# Patient Record
Sex: Female | Born: 1985
Health system: Southern US, Community
[De-identification: ages and names within clinical notes are randomized; demographics above are authoritative.]

## PROBLEM LIST (undated history)

## (undated) DIAGNOSIS — D649 Anemia, unspecified: Secondary | ICD-10-CM

## (undated) DIAGNOSIS — N61 Mastitis without abscess: Secondary | ICD-10-CM

## (undated) DIAGNOSIS — Z789 Other specified health status: Secondary | ICD-10-CM

## (undated) HISTORY — DX: Other specified health status: Z78.9

## (undated) HISTORY — PX: ABDOMINAL HYSTERECTOMY: SHX81

## (undated) HISTORY — PX: EAR TUBE REMOVAL: SHX1486

## (undated) HISTORY — DX: Anemia, unspecified: D64.9

## (undated) HISTORY — DX: Mastitis without abscess: N61.0

## (undated) HISTORY — PX: WISDOM TOOTH EXTRACTION: SHX21

---

## 2013-05-21 ENCOUNTER — Ambulatory Visit (INDEPENDENT_AMBULATORY_CARE_PROVIDER_SITE_OTHER): Payer: No Typology Code available for payment source | Admitting: Obstetrics & Gynecology

## 2013-05-21 ENCOUNTER — Encounter: Payer: Self-pay | Admitting: Obstetrics & Gynecology

## 2013-05-21 VITALS — BP 130/87 | HR 96 | Resp 16 | Ht 63.0 in | Wt 169.0 lb

## 2013-05-21 DIAGNOSIS — N915 Oligomenorrhea, unspecified: Secondary | ICD-10-CM

## 2013-05-21 DIAGNOSIS — Z1151 Encounter for screening for human papillomavirus (HPV): Secondary | ICD-10-CM

## 2013-05-21 DIAGNOSIS — Z113 Encounter for screening for infections with a predominantly sexual mode of transmission: Secondary | ICD-10-CM

## 2013-05-21 DIAGNOSIS — Z01419 Encounter for gynecological examination (general) (routine) without abnormal findings: Secondary | ICD-10-CM

## 2013-05-21 DIAGNOSIS — Z124 Encounter for screening for malignant neoplasm of cervix: Secondary | ICD-10-CM

## 2013-05-21 DIAGNOSIS — L680 Hirsutism: Secondary | ICD-10-CM

## 2013-05-22 LAB — DHEA-SULFATE: DHEA-SO4: 276 ug/dL (ref 35–430)

## 2013-05-23 DIAGNOSIS — N915 Oligomenorrhea, unspecified: Secondary | ICD-10-CM | POA: Insufficient documentation

## 2013-05-23 DIAGNOSIS — L680 Hirsutism: Secondary | ICD-10-CM | POA: Insufficient documentation

## 2013-05-23 NOTE — Progress Notes (Signed)
  Subjective:     Stacey Keith is a 27 y.o. female here for a routine exam.  Current complaints: irregular menses and infertility.  Personal health questionnaire reviewed: yes.  Pt is married and here for her first gyn exam.  Pt does not want birth control as she is trying to conceive.   Gynecologic History Patient's last menstrual period was 04/16/2013. Contraception: none Last Pap: never.  Last mammogram: never.   Obstetric History OB History  No data available  nulliparous   The following portions of the patient's history were reviewed and updated as appropriate: allergies, current medications, past family history, past medical history, past social history, past surgical history and problem list.  Review of Systems Pertinent items are noted in HPI.    Objective:   Filed Vitals:   05/21/13 1543  BP: 130/87  Pulse: 96  Resp: 16  Height: 5\' 3"  (1.6 m)  Weight: 169 lb (76.658 kg)      Vitals:  WNL General appearance: alert, cooperative and no distress Head: Normocephalic, without obvious abnormality, atraumatic; mild hirsuitism Eyes: negative Throat: lips, mucosa, and tongue normal; teeth and gums normal Lungs: clear to auscultation bilaterally Breasts: normal appearance, no masses or tenderness, No nipple retraction or dimpling, No nipple discharge or bleeding Heart: regular rate and rhythm Abdomen: soft, non-tender; bowel sounds normal; no masses,  no organomegaly; hirsuitism on lower abdomen. Pelvic: cervix normal in appearance, external genitalia normal, no adnexal masses or tenderness, no bladder tenderness, no cervical motion tenderness, perianal skin: no external genital warts noted, urethra without abnormality or discharge, uterus normal size, shape, and consistency (however limited exam due to body habitus) and vagina normal without discharge Extremities: no edema, redness or tenderness in the calves or thighs Skin: no lesions or rash (hirsuitism). Lymph nodes:  Axillary adenopathy: none        Assessment:    Healthy female exam.  Infertility Probably PCO   Plan:    Education reviewed: self breast exams and skin cancer screening. Contraception: none. Follow up in: 2 weeks. TSH, prolactin, DHEA-S, 17 ohp, 2 hr GTT with fasting insulin, testosterone Pap smear  RT 2 weeks

## 2013-05-26 ENCOUNTER — Telehealth: Payer: Self-pay | Admitting: *Deleted

## 2013-05-26 ENCOUNTER — Other Ambulatory Visit: Payer: No Typology Code available for payment source

## 2013-05-26 NOTE — Telephone Encounter (Signed)
LM on voicemail that she needed to come back in for a lab only draw for her

## 2013-05-26 NOTE — Progress Notes (Signed)
Pt here for blood redraw due to lab error on 17 hydroxyprogesterone.  No charge for venipuncture

## 2013-05-29 ENCOUNTER — Other Ambulatory Visit: Payer: No Typology Code available for payment source

## 2013-05-29 ENCOUNTER — Other Ambulatory Visit: Payer: Self-pay | Admitting: Obstetrics & Gynecology

## 2013-05-30 LAB — INSULIN, FASTING: Insulin fasting, serum: 23 u[IU]/mL (ref 3–28)

## 2013-05-30 LAB — GLUCOSE TOLERANCE, 2 HOURS W/ 1HR: Glucose, Fasting: 91 mg/dL (ref 70–99)

## 2013-05-30 LAB — 17-HYDROXYPROGESTERONE: 17-OH-Progesterone, LC/MS/MS: 37 ng/dL

## 2013-06-04 ENCOUNTER — Encounter: Payer: Self-pay | Admitting: Obstetrics & Gynecology

## 2013-06-04 ENCOUNTER — Ambulatory Visit (INDEPENDENT_AMBULATORY_CARE_PROVIDER_SITE_OTHER): Payer: No Typology Code available for payment source | Admitting: Obstetrics & Gynecology

## 2013-06-04 VITALS — BP 142/89 | HR 111 | Resp 16 | Ht 63.0 in | Wt 169.0 lb

## 2013-06-04 DIAGNOSIS — N979 Female infertility, unspecified: Secondary | ICD-10-CM

## 2013-06-04 DIAGNOSIS — IMO0002 Reserved for concepts with insufficient information to code with codable children: Secondary | ICD-10-CM

## 2013-06-04 MED ORDER — FLUCONAZOLE 100 MG PO TABS: 150.0000 mg | ORAL_TABLET | Freq: Every day | ORAL | Status: DC

## 2013-06-04 MED ORDER — DOXYCYCLINE HYCLATE 50 MG PO CAPS: ORAL_CAPSULE | ORAL | Status: DC

## 2013-06-04 MED ORDER — LETROZOLE 2.5 MG PO TABS: ORAL_TABLET | ORAL | Status: DC

## 2013-06-04 NOTE — Progress Notes (Signed)
  Subjective:    Patient ID: Stacey Keith, female    DOB: Aug 25, 1986, 27 y.o.   MRN: [id number]  HPI  Pt presents for test results and to discuss infertility. No lab abnormalities---testosterone, TSH, prolactin, GTT, DHEA-S, 17 OHP Discussed semen analysis, HSG, and Femara  Review of Systems Vaginal itching and burning    Objective:   Physical Exam  Constitutional: She is oriented to person, place, and time. She appears well-developed and well-nourished.  HENT:  Head: Normocephalic and atraumatic.  Pulmonary/Chest: Effort normal.  Abdominal: Soft.  Genitourinary: Vagina normal.  Musculoskeletal: She exhibits no edema.  Neurological: She is alert and oriented to person, place, and time.  Skin: Skin is warm and dry.  Psychiatric: She has a normal mood and affect.          Assessment & Plan:  Provera withdrawal bleed HSG Semen analysis Femara 5 mg Day 3-7 Doxy prior to HSG BD affirm for symptoms, treat empirically with diflucan.

## 2013-06-10 ENCOUNTER — Encounter: Payer: Self-pay | Admitting: *Deleted

## 2013-06-10 ENCOUNTER — Telehealth: Payer: Self-pay | Admitting: *Deleted

## 2013-06-10 ENCOUNTER — Other Ambulatory Visit: Payer: Self-pay | Admitting: Obstetrics & Gynecology

## 2013-06-10 ENCOUNTER — Other Ambulatory Visit: Payer: Self-pay | Admitting: *Deleted

## 2013-06-10 DIAGNOSIS — Z139 Encounter for screening, unspecified: Secondary | ICD-10-CM

## 2013-06-10 NOTE — Telephone Encounter (Signed)
Pt needed to come in for urine specimen for GC/Chlam testing - was not completed at last OV

## 2013-06-10 NOTE — Telephone Encounter (Signed)
Pt called to ask if there was some type of medication she can take to help start a cycle. She has the femara that she will take once cycle begins but she has not had a cycle since July. Please advise.

## 2013-06-10 NOTE — Telephone Encounter (Signed)
Called pt to adv partner should see urologist due to semen analysis report - [facility] for her to call back.

## 2013-06-11 LAB — GC/CHLAMYDIA PROBE AMP
CT Probe RNA: NEGATIVE
GC Probe RNA: NEGATIVE

## 2013-06-12 ENCOUNTER — Telehealth: Payer: Self-pay | Admitting: *Deleted

## 2013-06-12 DIAGNOSIS — N920 Excessive and frequent menstruation with regular cycle: Secondary | ICD-10-CM

## 2013-06-12 MED ORDER — MEDROXYPROGESTERONE ACETATE 10 MG PO TABS: 10.0000 mg | ORAL_TABLET | Freq: Every day | ORAL | Status: DC

## 2013-06-12 NOTE — Telephone Encounter (Signed)
Called pt to adv Provera will be sent to her pharm she will pick that up and begin. She will call us once she begins her cycle to schedule HSG

## 2013-06-22 ENCOUNTER — Telehealth: Payer: Self-pay | Admitting: *Deleted

## 2013-06-22 NOTE — Telephone Encounter (Signed)
Spoke with [employer] @ [employer] for prior authorization of Femara.  Pt does not qualify for coverage of that med.  Criteria is that Dr Penne Lash would have to be an ONC and pt is taking this for breast cancer which is not the case.

## 2013-06-27 ENCOUNTER — Encounter: Payer: Self-pay | Admitting: Obstetrics & Gynecology

## 2013-06-27 DIAGNOSIS — N469 Male infertility, unspecified: Secondary | ICD-10-CM | POA: Insufficient documentation

## 2013-08-06 ENCOUNTER — Other Ambulatory Visit: Payer: Self-pay

## 2013-12-10 ENCOUNTER — Encounter: Payer: No Typology Code available for payment source | Admitting: Obstetrics and Gynecology

## 2014-02-18 ENCOUNTER — Other Ambulatory Visit (HOSPITAL_COMMUNITY): Payer: Self-pay | Admitting: Obstetrics and Gynecology

## 2014-02-18 DIAGNOSIS — Q5128 Other doubling of uterus, other specified: Secondary | ICD-10-CM

## 2014-02-18 DIAGNOSIS — Q512 Other doubling of uterus, unspecified: Principal | ICD-10-CM

## 2014-02-25 ENCOUNTER — Ambulatory Visit (HOSPITAL_COMMUNITY)
Admission: RE | Admit: 2014-02-25 | Discharge: 2014-02-25 | Disposition: A | Discharge status: Home / Self Care | Payer: No Typology Code available for payment source | Source: Ambulatory Visit | Attending: Obstetrics and Gynecology | Admitting: Obstetrics and Gynecology

## 2014-02-25 DIAGNOSIS — N979 Female infertility, unspecified: Secondary | ICD-10-CM | POA: Insufficient documentation

## 2014-02-25 DIAGNOSIS — Q5128 Other doubling of uterus, other specified: Secondary | ICD-10-CM

## 2014-02-25 DIAGNOSIS — Q512 Other doubling of uterus, unspecified: Secondary | ICD-10-CM

## 2014-02-25 MED ORDER — IOHEXOL 300 MG/ML  SOLN
20.0000 mL | Freq: Once | INTRAMUSCULAR | Status: AC | PRN
  Administered 2014-02-25: 20 mL

## 2014-03-03 ENCOUNTER — Other Ambulatory Visit: Payer: Self-pay | Admitting: Obstetrics and Gynecology

## 2014-03-03 DIAGNOSIS — Q512 Other doubling of uterus, unspecified: Principal | ICD-10-CM

## 2014-03-03 DIAGNOSIS — Q5128 Other doubling of uterus, other specified: Secondary | ICD-10-CM

## 2014-03-09 ENCOUNTER — Ambulatory Visit
Admission: RE | Admit: 2014-03-09 | Discharge: 2014-03-09 | Disposition: A | Discharge status: Home / Self Care | Payer: No Typology Code available for payment source | Source: Ambulatory Visit | Attending: Obstetrics and Gynecology | Admitting: Obstetrics and Gynecology

## 2014-03-09 DIAGNOSIS — Q512 Other doubling of uterus, unspecified: Principal | ICD-10-CM

## 2014-03-09 DIAGNOSIS — Q5128 Other doubling of uterus, other specified: Secondary | ICD-10-CM

## 2014-03-09 MED ORDER — GADOBENATE DIMEGLUMINE 529 MG/ML IV SOLN
15.0000 mL | Freq: Once | INTRAVENOUS | Status: AC | PRN
  Administered 2014-03-09: 15 mL via INTRAVENOUS

## 2014-04-27 HISTORY — PX: UTERINE SEPTUM RESECTION: SHX5386

## 2014-06-01 ENCOUNTER — Inpatient Hospital Stay (HOSPITAL_COMMUNITY)
Admission: AD | Admit: 2014-06-01 | Payer: No Typology Code available for payment source | Source: Ambulatory Visit | Admitting: Obstetrics and Gynecology

## 2014-09-02 LAB — OB RESULTS CONSOLE GC/CHLAMYDIA
CHLAMYDIA, DNA PROBE: NEGATIVE
Gonorrhea: NEGATIVE

## 2014-09-02 LAB — OB RESULTS CONSOLE HGB/HCT, BLOOD
HEMATOCRIT: 34 %
HEMOGLOBIN: 11.8 g/dL

## 2014-09-02 LAB — OB RESULTS CONSOLE ANTIBODY SCREEN: Antibody Screen: NEGATIVE

## 2014-09-02 LAB — OB RESULTS CONSOLE PLATELET COUNT: Platelets: 325 10*3/uL

## 2014-09-02 LAB — OB RESULTS CONSOLE RPR: RPR: NONREACTIVE

## 2014-09-02 LAB — OB RESULTS CONSOLE ABO/RH: RH TYPE: POSITIVE

## 2014-09-02 LAB — OB RESULTS CONSOLE HIV ANTIBODY (ROUTINE TESTING): HIV: NONREACTIVE

## 2014-10-11 ENCOUNTER — Encounter: Payer: Self-pay | Admitting: *Deleted

## 2014-10-12 ENCOUNTER — Telehealth: Payer: Self-pay | Admitting: *Deleted

## 2014-10-12 DIAGNOSIS — O09299 Supervision of pregnancy with other poor reproductive or obstetric history, unspecified trimester: Secondary | ICD-10-CM

## 2014-10-12 DIAGNOSIS — O3400 Maternal care for unspecified congenital malformation of uterus, unspecified trimester: Secondary | ICD-10-CM

## 2014-10-12 DIAGNOSIS — Z141 Cystic fibrosis carrier: Secondary | ICD-10-CM | POA: Insufficient documentation

## 2014-10-12 DIAGNOSIS — Q5128 Other doubling of uterus, other specified: Secondary | ICD-10-CM | POA: Insufficient documentation

## 2014-10-12 DIAGNOSIS — O09891 Supervision of other high risk pregnancies, first trimester: Secondary | ICD-10-CM | POA: Insufficient documentation

## 2014-10-12 DIAGNOSIS — Q512 Other doubling of uterus, unspecified: Secondary | ICD-10-CM

## 2014-10-12 DIAGNOSIS — O0900 Supervision of pregnancy with history of infertility, unspecified trimester: Secondary | ICD-10-CM

## 2014-10-12 NOTE — Telephone Encounter (Signed)
Contacted patient and informed her that we need her records from her previous OB/GYN.  Pt will call the other office and see if they can fax the information, if not she will come fill out a ROI.  Fax number for the clinic provided.

## 2014-10-13 ENCOUNTER — Encounter: Payer: Self-pay | Admitting: Family

## 2014-10-13 ENCOUNTER — Ambulatory Visit (INDEPENDENT_AMBULATORY_CARE_PROVIDER_SITE_OTHER): Payer: Medicaid Other | Admitting: Family

## 2014-10-13 VITALS — BP 130/88 | HR 102 | Temp 98.3°F | Wt 184.3 lb

## 2014-10-13 DIAGNOSIS — Q512 Other doubling of uterus, unspecified: Principal | ICD-10-CM

## 2014-10-13 DIAGNOSIS — O34599 Maternal care for other abnormalities of gravid uterus, unspecified trimester: Secondary | ICD-10-CM

## 2014-10-13 DIAGNOSIS — O3680X1 Pregnancy with inconclusive fetal viability, fetus 1: Secondary | ICD-10-CM

## 2014-10-13 DIAGNOSIS — O0900 Supervision of pregnancy with history of infertility, unspecified trimester: Secondary | ICD-10-CM

## 2014-10-13 DIAGNOSIS — L680 Hirsutism: Secondary | ICD-10-CM

## 2014-10-13 DIAGNOSIS — O3400 Maternal care for unspecified congenital malformation of uterus, unspecified trimester: Secondary | ICD-10-CM

## 2014-10-13 DIAGNOSIS — Q5128 Other doubling of uterus, other specified: Secondary | ICD-10-CM

## 2014-10-13 LAB — US OB COMP + 14 WK

## 2014-10-13 LAB — POCT URINALYSIS DIP (DEVICE)
Bilirubin Urine: NEGATIVE
Glucose, UA: NEGATIVE mg/dL
Hgb urine dipstick: NEGATIVE
KETONES UR: NEGATIVE mg/dL
NITRITE: NEGATIVE
Protein, ur: NEGATIVE mg/dL
SPECIFIC GRAVITY, URINE: 1.025 (ref 1.005–1.030)
UROBILINOGEN UA: 0.2 mg/dL (ref 0.0–1.0)
pH: 6 (ref 5.0–8.0)

## 2014-10-13 NOTE — Patient Instructions (Signed)
Second Trimester of Pregnancy The second trimester is from week 13 through week 28, months 4 through 6. The second trimester is often a time when you feel your best. Your body has also adjusted to being pregnant, and you begin to feel better physically. Usually, morning sickness has lessened or quit completely, you may have more energy, and you may have an increase in appetite. The second trimester is also a time when the fetus is growing rapidly. At the end of the sixth month, the fetus is about 9 inches long and weighs about 1 pounds. You will likely begin to feel the baby move (quickening) between 18 and 20 weeks of the pregnancy. BODY CHANGES Your body goes through many changes during pregnancy. The changes vary from woman to woman.   Your weight will continue to increase. You will notice your lower abdomen bulging out.  You may begin to get stretch marks on your hips, abdomen, and breasts.  You may develop headaches that can be relieved by medicines approved by your health care provider.  You may urinate more often because the fetus is pressing on your bladder.  You may develop or continue to have heartburn as a result of your pregnancy.  You may develop constipation because certain hormones are causing the muscles that push waste through your intestines to slow down.  You may develop hemorrhoids or swollen, bulging veins (varicose veins).  You may have back pain because of the weight gain and pregnancy hormones relaxing your joints between the bones in your pelvis and as a result of a shift in weight and the muscles that support your balance.  Your breasts will continue to grow and be tender.  Your gums may bleed and may be sensitive to brushing and flossing.  Dark spots or blotches (chloasma, mask of pregnancy) may develop on your face. This will likely fade after the baby is born.  A dark line from your belly button to the pubic area (linea nigra) may appear. This will likely fade  after the baby is born.  You may have changes in your hair. These can include thickening of your hair, rapid growth, and changes in texture. Some women also have hair loss during or after pregnancy, or hair that feels dry or thin. Your hair will most likely return to normal after your baby is born. WHAT TO EXPECT AT YOUR PRENATAL VISITS During a routine prenatal visit:  You will be weighed to make sure you and the fetus are growing normally.  Your blood pressure will be taken.  Your abdomen will be measured to track your baby's growth.  The fetal heartbeat will be listened to.  Any test results from the previous visit will be discussed. Your health care provider may ask you:  How you are feeling.  If you are feeling the baby move.  If you have had any abnormal symptoms, such as leaking fluid, bleeding, severe headaches, or abdominal cramping.  If you have any questions. Other tests that may be performed during your second trimester include:  Blood tests that check for:  Low iron levels (anemia).  Gestational diabetes (between 24 and 28 weeks).  Rh antibodies.  Urine tests to check for infections, diabetes, or protein in the urine.  An ultrasound to confirm the proper growth and development of the baby.  An amniocentesis to check for possible genetic problems.  Fetal screens for spina bifida and Down syndrome. HOME CARE INSTRUCTIONS   Avoid all smoking, herbs, alcohol, and unprescribed   drugs. These chemicals affect the formation and growth of the baby.  Follow your health care provider's instructions regarding medicine use. There are medicines that are either safe or unsafe to take during pregnancy.  Exercise only as directed by your health care provider. Experiencing uterine cramps is a good sign to stop exercising.  Continue to eat regular, healthy meals.  Wear a good support bra for breast tenderness.  Do not use hot tubs, steam rooms, or saunas.  Wear your  seat belt at all times when driving.  Avoid raw meat, uncooked cheese, cat litter boxes, and soil used by cats. These carry germs that can cause birth defects in the baby.  Take your prenatal vitamins.  Try taking a stool softener (if your health care provider approves) if you develop constipation. Eat more high-fiber foods, such as fresh vegetables or fruit and whole grains. Drink plenty of fluids to keep your urine clear or pale yellow.  Take warm sitz baths to soothe any pain or discomfort caused by hemorrhoids. Use hemorrhoid cream if your health care provider approves.  If you develop varicose veins, wear support hose. Elevate your feet for 15 minutes, 3-4 times a day. Limit salt in your diet.  Avoid heavy lifting, wear low heel shoes, and practice good posture.  Rest with your legs elevated if you have leg cramps or low back pain.  Visit your dentist if you have not gone yet during your pregnancy. Use a soft toothbrush to brush your teeth and be gentle when you floss.  A sexual relationship may be continued unless your health care provider directs you otherwise.  Continue to go to all your prenatal visits as directed by your health care provider. SEEK MEDICAL CARE IF:   You have dizziness.  You have mild pelvic cramps, pelvic pressure, or nagging pain in the abdominal area.  You have persistent nausea, vomiting, or diarrhea.  You have a bad smelling vaginal discharge.  You have pain with urination. SEEK IMMEDIATE MEDICAL CARE IF:   You have a fever.  You are leaking fluid from your vagina.  You have spotting or bleeding from your vagina.  You have severe abdominal cramping or pain.  You have rapid weight gain or loss.  You have shortness of breath with chest pain.  You notice sudden or extreme swelling of your face, hands, ankles, feet, or legs.  You have not felt your baby move in over an hour.  You have severe headaches that do not go away with  medicine.  You have vision changes. Document Released: 09/11/2001 Document Revised: 09/22/2013 Document Reviewed: 11/18/2012 Exit[employer] Patient Information 2015 [employer]. This information is not intended to replace advice given to you by your health care provider. Make sure you discuss any questions you have with your health care provider.  

## 2014-10-13 NOTE — Progress Notes (Signed)
Initial visit today. [facility] up until 8 weeks and then [facility] prior to this appointment-- had initial labs done there. Some records available and abstracted-- patient faxed over ROI to both places to have all records faxed to clinic. Awaiting records.  New OB packet given.

## 2014-10-13 NOTE — Progress Notes (Signed)
Bedside US for viability = single IUP, FHR = 150 per PW doppler, FM present.  Rochele PagesWalidah Karim, CNM notified.

## 2014-10-13 NOTE — Progress Notes (Signed)
   Subjective:    Stacey Keith is a G2P0010 3123w0d being seen today for her first obstetrical visit.  Her obstetrical history is significant for history of infertility and uterine septate surgery in July 2015.  Marland Kitchen. Patient does intend to breast feed. Pregnancy history fully reviewed.  Few labs obtained [facility] - GC/CT, HIV, Pap, RPR, ABO/RH, antibody screen.    Patient reports backache, no bleeding, no contractions and no cramping.  Filed Vitals:   10/13/14 1000  BP: 130/88  Pulse: 102  Temp: 98.3 F (36.8 C)  Weight: 184 lb 4.8 oz (83.598 kg)    HISTORY: OB History  Gravida Para Term Preterm AB SAB TAB Ectopic Multiple Living  2    1 1  0 0 0 0    # Outcome Date GA Lbr Len/2nd Weight Sex Delivery Anes PTL Lv  2 Current           1 SAB 10/2013             History reviewed. No pertinent past medical history. Past Surgical History  Procedure Laterality Date  . Uterine septum resection  04/27/2014   Family History  Problem Relation Age of Onset  . Breast cancer Paternal Grandmother     Exam   BP 130/88 mmHg  Pulse 102  Temp(Src) 98.3 F (36.8 C)  Wt 184 lb 4.8 oz (83.598 kg)  LMP 06/23/2014 (Exact Date) Uterine Size: size equals dates  Pelvic Exam:    Perineum: No Hemorrhoids, Normal Perineum   Vulva: normal   Vagina:  normal mucosa, normal discharge, no palpable nodules   pH: Not done   Cervix: no bleeding following Pap, no cervical motion tenderness and no lesions   Adnexa: normal adnexa and no mass, fullness, tenderness   Bony Pelvis: Adequate  System: Breast:  No nipple retraction or dimpling, No nipple discharge or bleeding, No axillary or supraclavicular adenopathy, Normal to palpation without dominant masses   Skin: normal coloration and turgor, no rashes    Neurologic: negative   Extremities: normal strength, tone, and muscle mass   HEENT neck supple with midline trachea and thyroid without masses   Mouth/Teeth mucous membranes moist, pharynx normal  without lesions   Neck supple and no masses   Cardiovascular: regular rate and rhythm, no murmurs or gallops   Respiratory:  appears well, vitals normal, no respiratory distress, acyanotic, normal RR, neck free of mass or lymphadenopathy, chest clear, no wheezing, crepitations, rhonchi, normal symmetric air entry   Abdomen: soft, non-tender; bowel sounds normal; no masses,  no organomegaly   Urinary: urethral meatus normal      Assessment:    Pregnancy:   29 yo G2P0010 at 4523w0d wks IUP Patient Active Problem List   Diagnosis Date Noted  . History of miscarriage, currently pregnant 10/12/2014  . Supervision of high-risk pregnancy with history of infertility 10/12/2014  . Septate uterus, antepartum 10/12/2014  . Infertility female 06/27/2013  . Oligomenorrhea 05/23/2013  . Hirsutism 05/23/2013     Plan:     Initial labs drawn. Prenatal vitamins. Problem list reviewed and updated. Genetic Screening discussed Quad Screen: ordered.  Ultrasound discussed; fetal survey: ordered.  Follow up in 4 weeks.   Marlis EdelsonKARIM, Casimira Sutphin N 10/13/2014

## 2014-10-14 LAB — AFP, QUAD SCREEN
AFP: 44.8 ng/mL
Age Alone: 1:969 {titer}
CURR GEST AGE: 16 wks.days
Down Syndrome Scr Risk Est: <1:38500 {titer}
HCG TOTAL: 21.05 [IU]/mL
INH: 94.4 pg/mL
INTERPRETATION-AFP: NEGATIVE
MOM FOR AFP: 1.51
MoM for INH: 0.64
MoM for hCG: 0.6
Open Spina bifida: NEGATIVE
Osb Risk: 1:2730 {titer}
TRI 18 SCR RISK EST: NEGATIVE
UE3 VALUE: 0.98 ng/mL
uE3 Mom: 1.29

## 2014-10-14 LAB — HEPATITIS B SURFACE ANTIGEN: HEP B S AG: NEGATIVE

## 2014-10-15 ENCOUNTER — Encounter: Payer: Self-pay | Admitting: *Deleted

## 2014-10-15 LAB — PRESCRIPTION MONITORING PROFILE (19 PANEL)
AMPHETAMINE/METH: NEGATIVE ng/mL
BENZODIAZEPINE SCREEN, URINE: NEGATIVE ng/mL
BUPRENORPHINE, URINE: NEGATIVE ng/mL
Barbiturate Screen, Urine: NEGATIVE ng/mL
CARISOPRODOL, URINE: NEGATIVE ng/mL
COCAINE METABOLITES: NEGATIVE ng/mL
Cannabinoid Scrn, Ur: NEGATIVE ng/mL
Creatinine, Urine: 94.06 mg/dL (ref >20.0)
ECSTASY: NEGATIVE ng/mL
Fentanyl, Ur: NEGATIVE ng/mL
MEPERIDINE UR: NEGATIVE ng/mL
METHADONE SCREEN, URINE: NEGATIVE ng/mL
METHAQUALONE SCREEN (URINE): NEGATIVE ng/mL
Nitrites, Initial: NEGATIVE ug/mL
OPIATE SCREEN, URINE: NEGATIVE ng/mL
Oxycodone Screen, Ur: NEGATIVE ng/mL
PHENCYCLIDINE, UR: NEGATIVE ng/mL
Propoxyphene: NEGATIVE ng/mL
TRAMADOL UR: NEGATIVE ng/mL
Tapentadol, urine: NEGATIVE ng/mL
ZOLPIDEM, URINE: NEGATIVE ng/mL
pH, Initial: 5.9 pH (ref 4.5–8.9)

## 2014-10-15 LAB — CULTURE, OB URINE: Colony Count: >=100000

## 2014-10-18 LAB — RUBELLA ANTIBODY, IGM: Rubella IgM: 0.31

## 2014-10-23 ENCOUNTER — Encounter: Payer: Self-pay | Admitting: Family

## 2014-10-23 DIAGNOSIS — O9989 Other specified diseases and conditions complicating pregnancy, childbirth and the puerperium: Secondary | ICD-10-CM

## 2014-10-23 DIAGNOSIS — O09899 Supervision of other high risk pregnancies, unspecified trimester: Secondary | ICD-10-CM | POA: Insufficient documentation

## 2014-10-23 DIAGNOSIS — Z2839 Other underimmunization status: Secondary | ICD-10-CM | POA: Insufficient documentation

## 2014-10-23 DIAGNOSIS — Z283 Underimmunization status: Secondary | ICD-10-CM | POA: Insufficient documentation

## 2014-11-01 ENCOUNTER — Encounter: Payer: Self-pay | Admitting: Obstetrics & Gynecology

## 2014-11-05 ENCOUNTER — Other Ambulatory Visit: Payer: Self-pay | Admitting: Family

## 2014-11-05 ENCOUNTER — Ambulatory Visit (HOSPITAL_COMMUNITY)
Admission: RE | Admit: 2014-11-05 | Discharge: 2014-11-05 | Disposition: A | Discharge status: Home / Self Care | Payer: Medicaid Other | Source: Ambulatory Visit | Attending: Family | Admitting: Family

## 2014-11-05 DIAGNOSIS — Z36 Encounter for antenatal screening of mother: Secondary | ICD-10-CM | POA: Diagnosis not present

## 2014-11-05 DIAGNOSIS — Z3A19 19 weeks gestation of pregnancy: Secondary | ICD-10-CM | POA: Insufficient documentation

## 2014-11-05 DIAGNOSIS — Z3689 Encounter for other specified antenatal screening: Secondary | ICD-10-CM | POA: Insufficient documentation

## 2014-11-05 DIAGNOSIS — Z0000 Encounter for general adult medical examination without abnormal findings: Secondary | ICD-10-CM | POA: Insufficient documentation

## 2014-11-05 DIAGNOSIS — O0900 Supervision of pregnancy with history of infertility, unspecified trimester: Secondary | ICD-10-CM

## 2014-11-05 DIAGNOSIS — O4420 Partial placenta previa NOS or without hemorrhage, unspecified trimester: Secondary | ICD-10-CM | POA: Insufficient documentation

## 2014-11-11 ENCOUNTER — Ambulatory Visit (INDEPENDENT_AMBULATORY_CARE_PROVIDER_SITE_OTHER): Payer: Medicaid Other | Admitting: Obstetrics & Gynecology

## 2014-11-11 VITALS — BP 122/84 | HR 96 | Wt 182.9 lb

## 2014-11-11 DIAGNOSIS — O0902 Supervision of pregnancy with history of infertility, second trimester: Secondary | ICD-10-CM

## 2014-11-11 LAB — POCT URINALYSIS DIP (DEVICE)
Bilirubin Urine: NEGATIVE
Glucose, UA: NEGATIVE mg/dL
Hgb urine dipstick: NEGATIVE
Ketones, ur: NEGATIVE mg/dL
Nitrite: NEGATIVE
PROTEIN: NEGATIVE mg/dL
Specific Gravity, Urine: 1.015 (ref 1.005–1.030)
UROBILINOGEN UA: 0.2 mg/dL (ref 0.0–1.0)
pH: 7 (ref 5.0–8.0)

## 2014-11-11 NOTE — Progress Notes (Signed)
Follow-up U/S // @ 915a with Radiology.

## 2014-11-11 NOTE — Patient Instructions (Signed)
Second Trimester of Pregnancy The second trimester is from week 13 through week 28, months 4 through 6. The second trimester is often a time when you feel your best. Your body has also adjusted to being pregnant, and you begin to feel better physically. Usually, morning sickness has lessened or quit completely, you may have more energy, and you may have an increase in appetite. The second trimester is also a time when the fetus is growing rapidly. At the end of the sixth month, the fetus is about 9 inches long and weighs about 1 pounds. You will likely begin to feel the baby move (quickening) between 18 and 20 weeks of the pregnancy. BODY CHANGES Your body goes through many changes during pregnancy. The changes vary from woman to woman.   Your weight will continue to increase. You will notice your lower abdomen bulging out.  You may begin to get stretch marks on your hips, abdomen, and breasts.  You may develop headaches that can be relieved by medicines approved by your health care provider.  You may urinate more often because the fetus is pressing on your bladder.  You may develop or continue to have heartburn as a result of your pregnancy.  You may develop constipation because certain hormones are causing the muscles that push waste through your intestines to slow down.  You may develop hemorrhoids or swollen, bulging veins (varicose veins).  You may have back pain because of the weight gain and pregnancy hormones relaxing your joints between the bones in your pelvis and as a result of a shift in weight and the muscles that support your balance.  Your breasts will continue to grow and be tender.  Your gums may bleed and may be sensitive to brushing and flossing.  Dark spots or blotches (chloasma, mask of pregnancy) may develop on your face. This will likely fade after the baby is born.  A dark line from your belly button to the pubic area (linea nigra) may appear. This will likely fade  after the baby is born.  You may have changes in your hair. These can include thickening of your hair, rapid growth, and changes in texture. Some women also have hair loss during or after pregnancy, or hair that feels dry or thin. Your hair will most likely return to normal after your baby is born. WHAT TO EXPECT AT YOUR PRENATAL VISITS During a routine prenatal visit:  You will be weighed to make sure you and the fetus are growing normally.  Your blood pressure will be taken.  Your abdomen will be measured to track your baby's growth.  The fetal heartbeat will be listened to.  Any test results from the previous visit will be discussed. Your health care provider may ask you:  How you are feeling.  If you are feeling the baby move.  If you have had any abnormal symptoms, such as leaking fluid, bleeding, severe headaches, or abdominal cramping.  If you have any questions. Other tests that may be performed during your second trimester include:  Blood tests that check for:  Low iron levels (anemia).  Gestational diabetes (between 24 and 28 weeks).  Rh antibodies.  Urine tests to check for infections, diabetes, or protein in the urine.  An ultrasound to confirm the proper growth and development of the baby.  An amniocentesis to check for possible genetic problems.  Fetal screens for spina bifida and Down syndrome. HOME CARE INSTRUCTIONS   Avoid all smoking, herbs, alcohol, and unprescribed   drugs. These chemicals affect the formation and growth of the baby.  Follow your health care provider's instructions regarding medicine use. There are medicines that are either safe or unsafe to take during pregnancy.  Exercise only as directed by your health care provider. Experiencing uterine cramps is a good sign to stop exercising.  Continue to eat regular, healthy meals.  Wear a good support bra for breast tenderness.  Do not use hot tubs, steam rooms, or saunas.  Wear your  seat belt at all times when driving.  Avoid raw meat, uncooked cheese, cat litter boxes, and soil used by cats. These carry germs that can cause birth defects in the baby.  Take your prenatal vitamins.  Try taking a stool softener (if your health care provider approves) if you develop constipation. Eat more high-fiber foods, such as fresh vegetables or fruit and whole grains. Drink plenty of fluids to keep your urine clear or pale yellow.  Take warm sitz baths to soothe any pain or discomfort caused by hemorrhoids. Use hemorrhoid cream if your health care provider approves.  If you develop varicose veins, wear support hose. Elevate your feet for 15 minutes, 3-4 times a day. Limit salt in your diet.  Avoid heavy lifting, wear low heel shoes, and practice good posture.  Rest with your legs elevated if you have leg cramps or low back pain.  Visit your dentist if you have not gone yet during your pregnancy. Use a soft toothbrush to brush your teeth and be gentle when you floss.  A sexual relationship may be continued unless your health care provider directs you otherwise.  Continue to go to all your prenatal visits as directed by your health care provider. SEEK MEDICAL CARE IF:   You have dizziness.  You have mild pelvic cramps, pelvic pressure, or nagging pain in the abdominal area.  You have persistent nausea, vomiting, or diarrhea.  You have a bad smelling vaginal discharge.  You have pain with urination. SEEK IMMEDIATE MEDICAL CARE IF:   You have a fever.  You are leaking fluid from your vagina.  You have spotting or bleeding from your vagina.  You have severe abdominal cramping or pain.  You have rapid weight gain or loss.  You have shortness of breath with chest pain.  You notice sudden or extreme swelling of your face, hands, ankles, feet, or legs.  You have not felt your baby move in over an hour.  You have severe headaches that do not go away with  medicine.  You have vision changes. Document Released: 09/11/2001 Document Revised: 09/22/2013 Document Reviewed: 11/18/2012 Exit[employer] Patient Information 2015 [employer]. This information is not intended to replace advice given to you by your health care provider. Make sure you discuss any questions you have with your health care provider.  

## 2014-11-11 NOTE — Progress Notes (Signed)
US was nl last week except LLP 1.4 cm from os, repeat scan 6 weeks

## 2014-12-09 ENCOUNTER — Ambulatory Visit (INDEPENDENT_AMBULATORY_CARE_PROVIDER_SITE_OTHER): Payer: Medicaid Other | Admitting: Family Medicine

## 2014-12-09 VITALS — BP 126/74 | HR 81 | Temp 98.2°F | Wt 185.9 lb

## 2014-12-09 DIAGNOSIS — O0902 Supervision of pregnancy with history of infertility, second trimester: Secondary | ICD-10-CM

## 2014-12-09 LAB — POCT URINALYSIS DIP (DEVICE)
Bilirubin Urine: NEGATIVE
GLUCOSE, UA: NEGATIVE mg/dL
Hgb urine dipstick: NEGATIVE
KETONES UR: NEGATIVE mg/dL
Nitrite: NEGATIVE
Protein, ur: NEGATIVE mg/dL
Specific Gravity, Urine: 1.015 (ref 1.005–1.030)
UROBILINOGEN UA: 0.2 mg/dL (ref 0.0–1.0)
pH: 8.5 — ABNORMAL HIGH (ref 5.0–8.0)

## 2014-12-09 NOTE — Progress Notes (Signed)
Patient is 29 y.o. G2P0010 4750w1d.  +FM, denies LOF, VB, contractions; has light vaginal discharge.  Overall feeling well. Has sono scheduled to re-eval placenta

## 2014-12-17 ENCOUNTER — Ambulatory Visit (HOSPITAL_COMMUNITY)
Admission: RE | Admit: 2014-12-17 | Discharge: 2014-12-17 | Disposition: A | Discharge status: Home / Self Care | Payer: Medicaid Other | Source: Ambulatory Visit | Attending: Obstetrics & Gynecology | Admitting: Obstetrics & Gynecology

## 2014-12-17 DIAGNOSIS — Z3A25 25 weeks gestation of pregnancy: Secondary | ICD-10-CM | POA: Insufficient documentation

## 2014-12-17 DIAGNOSIS — Z0489 Encounter for examination and observation for other specified reasons: Secondary | ICD-10-CM | POA: Insufficient documentation

## 2014-12-17 DIAGNOSIS — O0902 Supervision of pregnancy with history of infertility, second trimester: Secondary | ICD-10-CM

## 2014-12-17 DIAGNOSIS — O3400 Maternal care for unspecified congenital malformation of uterus, unspecified trimester: Secondary | ICD-10-CM | POA: Insufficient documentation

## 2014-12-17 DIAGNOSIS — Z36 Encounter for antenatal screening of mother: Secondary | ICD-10-CM | POA: Diagnosis not present

## 2014-12-17 DIAGNOSIS — O4402 Placenta previa specified as without hemorrhage, second trimester: Secondary | ICD-10-CM | POA: Insufficient documentation

## 2014-12-17 DIAGNOSIS — IMO0002 Reserved for concepts with insufficient information to code with codable children: Secondary | ICD-10-CM | POA: Insufficient documentation

## 2014-12-17 DIAGNOSIS — O4400 Placenta previa specified as without hemorrhage, unspecified trimester: Secondary | ICD-10-CM | POA: Insufficient documentation

## 2014-12-20 ENCOUNTER — Telehealth: Payer: Self-pay | Admitting: General Practice

## 2014-12-20 NOTE — Telephone Encounter (Signed)
Patient called and left message stating she is returning our call and that we can leave results on her voicemail

## 2014-12-20 NOTE — Telephone Encounter (Signed)
Patient called and left message stating she would like her results from her ultrasound on Friday. Called patient, no answer- left message stating we are trying to return your phone call, please call us back at the clinics and you can let us know if we can leave detailed information on your voicemail or not

## 2014-12-21 NOTE — Telephone Encounter (Signed)
Called patient and informed her of normal ultrasound. Patient verbalized understanding and asked about her placenta. Told patient her placenta has moved back up to a normal location. Patient verbalized understanding and had no questions

## 2015-01-06 ENCOUNTER — Ambulatory Visit (INDEPENDENT_AMBULATORY_CARE_PROVIDER_SITE_OTHER): Payer: Self-pay | Admitting: Family Medicine

## 2015-01-06 VITALS — BP 124/76 | HR 88 | Wt 189.9 lb

## 2015-01-06 DIAGNOSIS — O0900 Supervision of pregnancy with history of infertility, unspecified trimester: Secondary | ICD-10-CM

## 2015-01-06 DIAGNOSIS — O4413 Placenta previa with hemorrhage, third trimester: Secondary | ICD-10-CM

## 2015-01-06 DIAGNOSIS — Z23 Encounter for immunization: Secondary | ICD-10-CM

## 2015-01-06 DIAGNOSIS — O4420 Partial placenta previa NOS or without hemorrhage, unspecified trimester: Secondary | ICD-10-CM

## 2015-01-06 DIAGNOSIS — O0903 Supervision of pregnancy with history of infertility, third trimester: Secondary | ICD-10-CM

## 2015-01-06 LAB — POCT URINALYSIS DIP (DEVICE)
BILIRUBIN URINE: NEGATIVE
GLUCOSE, UA: NEGATIVE mg/dL
HGB URINE DIPSTICK: NEGATIVE
Ketones, ur: NEGATIVE mg/dL
NITRITE: NEGATIVE
Protein, ur: NEGATIVE mg/dL
Specific Gravity, Urine: 1.02 (ref 1.005–1.030)
Urobilinogen, UA: 0.2 mg/dL (ref 0.0–1.0)
pH: 7 (ref 5.0–8.0)

## 2015-01-06 LAB — CBC
HEMATOCRIT: 30.6 % — AB (ref 36.0–46.0)
Hemoglobin: 10.1 g/dL — ABNORMAL LOW (ref 12.0–15.0)
MCH: 27.2 pg (ref 26.0–34.0)
MCHC: 33 g/dL (ref 30.0–36.0)
MCV: 82.3 fL (ref 78.0–100.0)
MPV: 8.9 fL (ref 8.6–12.4)
Platelets: 279 10*3/uL (ref 150–400)
RBC: 3.72 MIL/uL — ABNORMAL LOW (ref 3.87–5.11)
RDW: 14.8 % (ref 11.5–15.5)
WBC: 8.3 10*3/uL (ref 4.0–10.5)

## 2015-01-06 MED ORDER — TETANUS-DIPHTH-ACELL PERTUSSIS 5-2.5-18.5 LF-MCG/0.5 IM SUSP
0.5000 mL | Freq: Once | INTRAMUSCULAR | Status: AC
  Administered 2015-01-06: 0.5 mL via INTRAMUSCULAR

## 2015-01-06 NOTE — Progress Notes (Signed)
Mod leuks in urine.  

## 2015-01-06 NOTE — Progress Notes (Signed)
Patient is 29 y.o. G2P0010 1459w1d.  +FM, denies LOF, VB, contractions, slightly increased white vaginal discharge, denies itchiness/irritation/complaints.  Overall feeling well. - repeat sono @ 25w with previa resolved

## 2015-01-07 LAB — HIV ANTIBODY (ROUTINE TESTING W REFLEX): HIV 1&2 Ab, 4th Generation: NONREACTIVE

## 2015-01-07 LAB — GLUCOSE TOLERANCE, 1 HOUR (50G) W/O FASTING: Glucose, 1 Hour GTT: 144 mg/dL — ABNORMAL HIGH (ref 70–140)

## 2015-01-07 LAB — RPR

## 2015-01-12 ENCOUNTER — Telehealth: Payer: Self-pay | Admitting: *Deleted

## 2015-01-12 NOTE — Telephone Encounter (Signed)
Pt left message requesting results of GTT. She stated that a message can be left on her voice mail. Call was returned to pt and a detailed message was left stating that her 1hr GTT was abnormal. She will need to have a 3hr GTT to determine if she has gestational diabetes. Procedural information for the 3hr GTT was stated. Please call back to the appt line and schedule your test. If you have questions you may leave a new message on the nurse voice mail.

## 2015-01-14 ENCOUNTER — Other Ambulatory Visit: Payer: Self-pay

## 2015-01-14 DIAGNOSIS — O99814 Abnormal glucose complicating childbirth: Secondary | ICD-10-CM

## 2015-01-15 LAB — GLUCOSE TOLERANCE, 3 HOURS
GLUCOSE, FASTING-GESTATIONAL: 81 mg/dL (ref 70–104)
Glucose Tolerance, 1 hour: 139 mg/dL (ref 70–189)
Glucose Tolerance, 2 hour: 104 mg/dL (ref 70–164)
Glucose, GTT - 3 Hour: 93 mg/dL (ref 70–144)

## 2015-01-18 ENCOUNTER — Telehealth: Payer: Self-pay

## 2015-01-18 NOTE — Telephone Encounter (Signed)
Patient called requeting 3hr results and states results can be left over voicemail. Called patient and left message informing her of normal results.

## 2015-01-27 ENCOUNTER — Ambulatory Visit (INDEPENDENT_AMBULATORY_CARE_PROVIDER_SITE_OTHER): Payer: Self-pay | Admitting: Family Medicine

## 2015-01-27 VITALS — BP 123/85 | HR 95 | Temp 98.3°F | Wt 191.7 lb

## 2015-01-27 DIAGNOSIS — O99619 Diseases of the digestive system complicating pregnancy, unspecified trimester: Secondary | ICD-10-CM

## 2015-01-27 DIAGNOSIS — K589 Irritable bowel syndrome without diarrhea: Secondary | ICD-10-CM

## 2015-01-27 LAB — POCT URINALYSIS DIP (DEVICE)
BILIRUBIN URINE: NEGATIVE
Glucose, UA: NEGATIVE mg/dL
HGB URINE DIPSTICK: NEGATIVE
Ketones, ur: NEGATIVE mg/dL
Nitrite: NEGATIVE
PH: 7 (ref 5.0–8.0)
Protein, ur: NEGATIVE mg/dL
Specific Gravity, Urine: 1.02 (ref 1.005–1.030)
Urobilinogen, UA: 0.2 mg/dL (ref 0.0–1.0)

## 2015-01-27 NOTE — Progress Notes (Signed)
Moderate leuks on Udip 

## 2015-01-27 NOTE — Progress Notes (Signed)
Patient is 29 y.o. G2P0010 7957w1d.  +FM, denies LOF, VB, contractions. Overall feeling well. # diarrhea: x2d, up to 3/day, no blood, no recent sick contacts, no recent abx, nothing improves, notices that it's usually 1h after meals.  Some abdominal cramping before, had similar problems in 1st trimester and before pregnancy ==> likely has irritable bowel syndrome

## 2015-01-27 NOTE — Patient Instructions (Signed)
Irritable Bowel Syndrome Irritable bowel syndrome (IBS) is caused by a disturbance of normal bowel function and is a common digestive disorder. You may also hear this condition called spastic colon, mucous colitis, and irritable colon. There is no cure for IBS. However, symptoms often gradually improve or disappear with a good diet, stress management, and medicine. This condition usually appears in late adolescence or early adulthood. Women develop it twice as often as men. CAUSES  After food has been digested and absorbed in the small intestine, waste material is moved into the large intestine, or colon. In the colon, water and salts are absorbed from the undigested products coming from the small intestine. The remaining residue, or fecal material, is held for elimination. Under normal circumstances, gentle, rhythmic contractions of the bowel walls push the fecal material along the colon toward the rectum. In IBS, however, these contractions are irregular and poorly coordinated. The fecal material is either retained too long, resulting in constipation, or expelled too soon, producing diarrhea. SIGNS AND SYMPTOMS  The most common symptom of IBS is abdominal pain. It is often in the lower left side of the abdomen, but it may occur anywhere in the abdomen. The pain comes from spasms of the bowel muscles happening too much and from the buildup of gas and fecal material in the colon. This pain:  Can range from sharp abdominal cramps to a dull, continuous ache.  Often worsens soon after eating.  Is often relieved by having a bowel movement or passing gas. Abdominal pain is usually accompanied by constipation, but it may also produce diarrhea. The diarrhea often occurs right after a meal or upon waking up in the morning. The stools are often soft, watery, and flecked with mucus. Other symptoms of IBS include:  Bloating.  Loss of appetite.  Heartburn.  Backache.  Dull pain in the arms or  shoulders.  Nausea.  Burping.  Vomiting.  Gas. IBS may also cause symptoms that are unrelated to the digestive system, such as:  Fatigue.  Headaches.  Anxiety.  Shortness of breath.  Trouble concentrating.  Dizziness. These symptoms tend to come and go. DIAGNOSIS  The symptoms of IBS may seem like symptoms of other, more serious digestive disorders. Your health care provider may want to perform tests to exclude these disorders.  TREATMENT Many medicines are available to help correct bowel function or relieve bowel spasms and abdominal pain. Among the medicines available are:  Laxatives for severe constipation and to help restore normal bowel habits.  Specific antidiarrheal medicines to treat severe or lasting diarrhea.  Antispasmodic agents to relieve intestinal cramps. Your health care provider may also decide to treat you with a mild tranquilizer or sedative during unusually stressful periods in your life. Your health care provider may also prescribe antidepressant medicine. The use of this medicine has been shown to reduce pain and other symptoms of IBS. Remember that if any medicine is prescribed for you, you should take it exactly as directed. Make sure your health care provider knows how well it worked for you. HOME CARE INSTRUCTIONS   Take all medicines as directed by your health care provider.  Avoid foods that are high in fat or oils, such as heavy cream, butter, frankfurters, sausage, and other fatty meats.  Avoid foods that make you go to the bathroom, such as fruit, fruit juice, and dairy products.  Cut out carbonated drinks, chewing gum, and "gassy" foods such as beans and cabbage. This may help relieve bloating and burping.    Eat foods with bran, and drink plenty of liquids with the bran foods. This helps relieve constipation.  Keep track of what foods seem to bring on your symptoms.  Avoid emotionally charged situations or circumstances that produce  anxiety.  Start or continue exercising.  Get plenty of rest and sleep. Document Released: 09/17/2005 Document Revised: 09/22/2013 Document Reviewed: 05/07/2008 ExitCare Patient Information 2015 [employer]. This information is not intended to replace advice given to you by your health care provider. Make sure you discuss any questions you have with your health care provider. Diet and Irritable Bowel Syndrome  No cure has been found for irritable bowel syndrome (IBS). Many options are available to treat the symptoms. Your caregiver will give you the best treatments available for your symptoms. He or she will also encourage you to manage stress and to make changes to your diet. You need to work with your caregiver and Registered Dietician to find the best combination of medicine, diet, counseling, and support to control your symptoms. The following are some diet suggestions. FOODS THAT MAKE IBS WORSE  Fatty foods, such as French fries.  Milk products, such as cheese or ice cream.  Chocolate.  Alcohol.  Caffeine (found in coffee and some sodas).  Carbonated drinks, such as soda. If certain foods cause symptoms, you should eat less of them or stop eating them. FOOD JOURNAL   Keep a journal of the foods that seem to cause distress. Write down:  What you are eating during the day and when.  What problems you are having after eating.  When the symptoms occur in relation to your meals.  What foods always make you feel badly.  Take your notes with you to your caregiver to see if you should stop eating certain foods. FOODS THAT MAKE IBS BETTER Fiber reduces IBS symptoms, especially constipation, because it makes stools soft, bulky, and easier to pass. Fiber is found in bran, bread, cereal, beans, fruit, and vegetables. Examples of foods with fiber include:  Apples.  Peaches.  Pears.  Berries.  Figs.  Broccoli, raw.  Cabbage.  Carrots.  Raw peas.  Kidney  beans.  Lima beans.  Whole-grain bread.  Whole-grain cereal. Add foods with fiber to your diet a little at a time. This will let your body get used to them. Too much fiber at once might cause gas and swelling of your abdomen. This can trigger symptoms in a person with IBS. Caregivers usually recommend a diet with enough fiber to produce soft, painless bowel movements. High fiber diets may cause gas and bloating. However, these symptoms often go away within a few weeks, as your body adjusts. In many cases, dietary fiber may lessen IBS symptoms, particularly constipation. However, it may not help pain or diarrhea. High fiber diets keep the colon mildly enlarged (distended) with the added fiber. This may help prevent spasms in the colon. Some forms of fiber also keep water in the stool, thereby preventing hard stools that are difficult to pass.  Besides telling you to eat more foods with fiber, your caregiver may also tell you to get more fiber by taking a fiber pill or drinking water mixed with a special high fiber powder. An example of this is a natural fiber laxative containing psyllium seed.  TIPS  Large meals can cause cramping and diarrhea in people with IBS. If this happens to you, try eating 4 or 5 small meals a day, or try eating less at each of your usual 3 meals.   It may also help if your meals are low in fat and high in carbohydrates. Examples of carbohydrates are pasta, rice, whole-grain breads and cereals, fruits, and vegetables.  If dairy products cause your symptoms to flare up, you can try eating less of those foods. You might be able to handle yogurt better than other dairy products, because it contains bacteria that helps with digestion. Dairy products are an important source of calcium and other nutrients. If you need to avoid dairy products, be sure to talk with a Registered Dietitian about getting these nutrients through other food sources.  Drink enough water and fluids to keep  your urine clear or pale yellow. This is important, especially if you have diarrhea. FOR MORE INFORMATION  International Foundation for Functional Gastrointestinal Disorders: www.iffgd.org  National Digestive Diseases Information Clearinghouse: digestive.niddk.nih.gov Document Released: 12/08/2003 Document Revised: 12/10/2011 Document Reviewed: 12/18/2013 ExitCare Patient Information 2015 [employer]. This information is not intended to replace advice given to you by your health care provider. Make sure you discuss any questions you have with your health care provider.  

## 2015-01-27 NOTE — Progress Notes (Signed)
Pt had diarrhea the past 2 ways

## 2015-02-10 ENCOUNTER — Encounter: Payer: Self-pay | Admitting: Obstetrics and Gynecology

## 2015-02-10 ENCOUNTER — Ambulatory Visit (INDEPENDENT_AMBULATORY_CARE_PROVIDER_SITE_OTHER): Payer: Medicaid Other | Admitting: Obstetrics and Gynecology

## 2015-02-10 VITALS — BP 129/83 | HR 89 | Temp 98.4°F | Wt 194.2 lb

## 2015-02-10 DIAGNOSIS — K589 Irritable bowel syndrome without diarrhea: Secondary | ICD-10-CM

## 2015-02-10 DIAGNOSIS — O0903 Supervision of pregnancy with history of infertility, third trimester: Secondary | ICD-10-CM

## 2015-02-10 DIAGNOSIS — O99613 Diseases of the digestive system complicating pregnancy, third trimester: Secondary | ICD-10-CM

## 2015-02-10 DIAGNOSIS — Z283 Underimmunization status: Secondary | ICD-10-CM

## 2015-02-10 DIAGNOSIS — Z2839 Other underimmunization status: Secondary | ICD-10-CM

## 2015-02-10 DIAGNOSIS — O9989 Other specified diseases and conditions complicating pregnancy, childbirth and the puerperium: Secondary | ICD-10-CM

## 2015-02-10 LAB — POCT URINALYSIS DIP (DEVICE)
Bilirubin Urine: NEGATIVE
GLUCOSE, UA: NEGATIVE mg/dL
KETONES UR: NEGATIVE mg/dL
Nitrite: NEGATIVE
Protein, ur: NEGATIVE mg/dL
Specific Gravity, Urine: 1.015 (ref 1.005–1.030)
UROBILINOGEN UA: 0.2 mg/dL (ref 0.0–1.0)
pH: 7.5 (ref 5.0–8.0)

## 2015-02-10 NOTE — Progress Notes (Signed)
Pain- sharp pain lasting seconds going to the cervix, right side pain

## 2015-02-10 NOTE — Progress Notes (Signed)
Patient is doing well without complaints. FM/PTL precautions reviewed. Patient is undecided on contraception, probably condoms

## 2015-02-17 ENCOUNTER — Ambulatory Visit (INDEPENDENT_AMBULATORY_CARE_PROVIDER_SITE_OTHER): Payer: Medicaid Other | Admitting: Obstetrics & Gynecology

## 2015-02-17 VITALS — BP 125/83 | HR 102 | Temp 98.4°F | Wt 194.1 lb

## 2015-02-17 DIAGNOSIS — O0903 Supervision of pregnancy with history of infertility, third trimester: Secondary | ICD-10-CM

## 2015-02-17 LAB — POCT URINALYSIS DIP (DEVICE)
Bilirubin Urine: NEGATIVE
Glucose, UA: NEGATIVE mg/dL
Hgb urine dipstick: NEGATIVE
Ketones, ur: NEGATIVE mg/dL
NITRITE: NEGATIVE
PH: 6 (ref 5.0–8.0)
PROTEIN: NEGATIVE mg/dL
Specific Gravity, Urine: 1.02 (ref 1.005–1.030)
UROBILINOGEN UA: 0.2 mg/dL (ref 0.0–1.0)

## 2015-02-17 NOTE — Patient Instructions (Signed)
Third Trimester of Pregnancy The third trimester is from week 29 through week 42, months 7 through 9. The third trimester is a time when the fetus is growing rapidly. At the end of the ninth month, the fetus is about 20 inches in length and weighs 6-10 pounds.  BODY CHANGES Your body goes through many changes during pregnancy. The changes vary from woman to woman.   Your weight will continue to increase. You can expect to gain 25-35 pounds (11-16 kg) by the end of the pregnancy.  You may begin to get stretch marks on your hips, abdomen, and breasts.  You may urinate more often because the fetus is moving lower into your pelvis and pressing on your bladder.  You may develop or continue to have heartburn as a result of your pregnancy.  You may develop constipation because certain hormones are causing the muscles that push waste through your intestines to slow down.  You may develop hemorrhoids or swollen, bulging veins (varicose veins).  You may have pelvic pain because of the weight gain and pregnancy hormones relaxing your joints between the bones in your pelvis. Backaches may result from overexertion of the muscles supporting your posture.  You may have changes in your hair. These can include thickening of your hair, rapid growth, and changes in texture. Some women also have hair loss during or after pregnancy, or hair that feels dry or thin. Your hair will most likely return to normal after your baby is born.  Your breasts will continue to grow and be tender. A yellow discharge may leak from your breasts called colostrum.  Your belly button may stick out.  You may feel short of breath because of your expanding uterus.  You may notice the fetus "dropping," or moving lower in your abdomen.  You may have a bloody mucus discharge. This usually occurs a few days to a week before labor begins.  Your cervix becomes thin and soft (effaced) near your due date. WHAT TO EXPECT AT YOUR PRENATAL  EXAMS  You will have prenatal exams every 2 weeks until week 36. Then, you will have weekly prenatal exams. During a routine prenatal visit:  You will be weighed to make sure you and the fetus are growing normally.  Your blood pressure is taken.  Your abdomen will be measured to track your baby's growth.  The fetal heartbeat will be listened to.  Any test results from the previous visit will be discussed.  You may have a cervical check near your due date to see if you have effaced. At around 36 weeks, your caregiver will check your cervix. At the same time, your caregiver will also perform a test on the secretions of the vaginal tissue. This test is to determine if a type of bacteria, Group B streptococcus, is present. Your caregiver will explain this further. Your caregiver may ask you:  What your birth plan is.  How you are feeling.  If you are feeling the baby move.  If you have had any abnormal symptoms, such as leaking fluid, bleeding, severe headaches, or abdominal cramping.  If you have any questions. Other tests or screenings that may be performed during your third trimester include:  Blood tests that check for low iron levels (anemia).  Fetal testing to check the health, activity level, and growth of the fetus. Testing is done if you have certain medical conditions or if there are problems during the pregnancy. FALSE LABOR You may feel small, irregular contractions that   eventually go away. These are called Braxton Hicks contractions, or false labor. Contractions may last for hours, days, or even weeks before true labor sets in. If contractions come at regular intervals, intensify, or become painful, it is best to be seen by your caregiver.  SIGNS OF LABOR   Menstrual-like cramps.  Contractions that are 5 minutes apart or less.  Contractions that start on the top of the uterus and spread down to the lower abdomen and back.  A sense of increased pelvic pressure or back  pain.  A watery or bloody mucus discharge that comes from the vagina. If you have any of these signs before the 37th week of pregnancy, call your caregiver right away. You need to go to the hospital to get checked immediately. HOME CARE INSTRUCTIONS   Avoid all smoking, herbs, alcohol, and unprescribed drugs. These chemicals affect the formation and growth of the baby.  Follow your caregiver's instructions regarding medicine use. There are medicines that are either safe or unsafe to take during pregnancy.  Exercise only as directed by your caregiver. Experiencing uterine cramps is a good sign to stop exercising.  Continue to eat regular, healthy meals.  Wear a good support bra for breast tenderness.  Do not use hot tubs, steam rooms, or saunas.  Wear your seat belt at all times when driving.  Avoid raw meat, uncooked cheese, cat litter boxes, and soil used by cats. These carry germs that can cause birth defects in the baby.  Take your prenatal vitamins.  Try taking a stool softener (if your caregiver approves) if you develop constipation. Eat more high-fiber foods, such as fresh vegetables or fruit and whole grains. Drink plenty of fluids to keep your urine clear or pale yellow.  Take warm sitz baths to soothe any pain or discomfort caused by hemorrhoids. Use hemorrhoid cream if your caregiver approves.  If you develop varicose veins, wear support hose. Elevate your feet for 15 minutes, 3-4 times a day. Limit salt in your diet.  Avoid heavy lifting, wear low heal shoes, and practice good posture.  Rest a lot with your legs elevated if you have leg cramps or low back pain.  Visit your dentist if you have not gone during your pregnancy. Use a soft toothbrush to brush your teeth and be gentle when you floss.  A sexual relationship may be continued unless your caregiver directs you otherwise.  Do not travel far distances unless it is absolutely necessary and only with the approval  of your caregiver.  Take prenatal classes to understand, practice, and ask questions about the labor and delivery.  Make a trial run to the hospital.  Pack your hospital bag.  Prepare the baby's nursery.  Continue to go to all your prenatal visits as directed by your caregiver. SEEK MEDICAL CARE IF:  You are unsure if you are in labor or if your water has broken.  You have dizziness.  You have mild pelvic cramps, pelvic pressure, or nagging pain in your abdominal area.  You have persistent nausea, vomiting, or diarrhea.  You have a bad smelling vaginal discharge.  You have pain with urination. SEEK IMMEDIATE MEDICAL CARE IF:   You have a fever.  You are leaking fluid from your vagina.  You have spotting or bleeding from your vagina.  You have severe abdominal cramping or pain.  You have rapid weight loss or gain.  You have shortness of breath with chest pain.  You notice sudden or extreme swelling   of your face, hands, ankles, feet, or legs.  You have not felt your baby move in over an hour.  You have severe headaches that do not go away with medicine.  You have vision changes. Document Released: 09/11/2001 Document Revised: 09/22/2013 Document Reviewed: 11/18/2012 ExitCare Patient Information 2015 [employer]. This information is not intended to replace advice given to you by your health care provider. Make sure you discuss any questions you have with your health care provider.  

## 2015-02-17 NOTE — Progress Notes (Signed)
Small leuks on UA; reports pain around pubic bone

## 2015-02-17 NOTE — Progress Notes (Signed)
No complaints, IBS is quiet now

## 2015-03-03 ENCOUNTER — Other Ambulatory Visit: Payer: Self-pay | Admitting: Family

## 2015-03-03 ENCOUNTER — Ambulatory Visit (INDEPENDENT_AMBULATORY_CARE_PROVIDER_SITE_OTHER): Payer: Medicaid Other | Admitting: Family

## 2015-03-03 VITALS — BP 137/83 | HR 94 | Temp 98.3°F | Wt 198.1 lb

## 2015-03-03 DIAGNOSIS — O0993 Supervision of high risk pregnancy, unspecified, third trimester: Secondary | ICD-10-CM

## 2015-03-03 DIAGNOSIS — O0903 Supervision of pregnancy with history of infertility, third trimester: Secondary | ICD-10-CM

## 2015-03-03 LAB — POCT URINALYSIS DIP (DEVICE)
Bilirubin Urine: NEGATIVE
Glucose, UA: NEGATIVE mg/dL
HGB URINE DIPSTICK: NEGATIVE
KETONES UR: NEGATIVE mg/dL
Nitrite: NEGATIVE
PROTEIN: NEGATIVE mg/dL
Specific Gravity, Urine: 1.015 (ref 1.005–1.030)
UROBILINOGEN UA: 0.2 mg/dL (ref 0.0–1.0)
pH: 6.5 (ref 5.0–8.0)

## 2015-03-03 LAB — OB RESULTS CONSOLE GBS: STREP GROUP B AG: NEGATIVE

## 2015-03-03 NOTE — Progress Notes (Signed)
Pain- "cramping"    

## 2015-03-03 NOTE — Progress Notes (Signed)
Questions regarding fetal position.  GBS and GC and chlamydia collected.  Reviewed signs of preterm labor.  Husband going to bowling tournament this weekend.

## 2015-03-05 LAB — CULTURE, BETA STREP (GROUP B ONLY)

## 2015-03-05 LAB — GC/CHLAMYDIA PROBE AMP
CT PROBE, AMP APTIMA: NEGATIVE
GC Probe RNA: NEGATIVE

## 2015-03-10 ENCOUNTER — Ambulatory Visit (INDEPENDENT_AMBULATORY_CARE_PROVIDER_SITE_OTHER): Payer: Medicaid Other | Admitting: Family

## 2015-03-10 VITALS — BP 126/84 | HR 104 | Temp 98.5°F | Wt 199.2 lb

## 2015-03-10 DIAGNOSIS — Q512 Other doubling of uterus, unspecified: Secondary | ICD-10-CM

## 2015-03-10 DIAGNOSIS — O0903 Supervision of pregnancy with history of infertility, third trimester: Secondary | ICD-10-CM | POA: Diagnosis not present

## 2015-03-10 DIAGNOSIS — O3400 Maternal care for unspecified congenital malformation of uterus, unspecified trimester: Secondary | ICD-10-CM

## 2015-03-10 DIAGNOSIS — O34599 Maternal care for other abnormalities of gravid uterus, unspecified trimester: Secondary | ICD-10-CM

## 2015-03-10 DIAGNOSIS — Q5128 Other doubling of uterus, other specified: Secondary | ICD-10-CM

## 2015-03-10 NOTE — Progress Notes (Signed)
Stacey Keith  is a 29 y.o. female being seen today for her obstetrical visit. She is at [redacted]w[redacted]d . Patient reports occasional contractions. Fetal movement: normal.  Also reports burning in vaginal area; no abnormal discharge noted.    Menstrual History: OB History    Gravida Para Term Preterm AB TAB SAB Ectopic Multiple Living   5 3 3  0 1 1 0 0 0 3      The following portions of the patient's history were reviewed and updated as appropriate: allergies, current medications, past family history, past medical history, past social history, past surgical history and problem list.  Review of Systems Genitourinary: Reports intermittent contractions.  Denies vaginal bleeding or leaking of fluid.     Objective:   Filed Vitals:   03/10/15 1038  BP: 126/84  Pulse: 104  Temp: 98.5 F (36.9 C)    FHT: 138 BPM  Uterine Size: 37 cm  Presentations: cephalic  Pelvic Exam: No abnormal discharge/lesions noted     Assessment:   G2P0010 at [redacted]w[redacted]d wks IUP Vaginal burning  Plan:   Plans for delivery: Vaginal anticipated; Reviewed pt history of uncomplicated hysteroscopic metroplasty on 04/27/2014 Beta strep culture: discussed, reviewed results Wet prep sent to lab Counseling: L&D discussion: symptoms of labor and rupture of membranes. Reviewed kick count information Follow up in 1 Week.    Eino Farber Kennith Gain, CNM

## 2015-03-10 NOTE — Progress Notes (Signed)
Small leuks noted in urine.  

## 2015-03-10 NOTE — Progress Notes (Signed)
Small leuks in UA Reviewed tip of week

## 2015-03-11 LAB — POCT URINALYSIS DIP (DEVICE)
Bilirubin Urine: NEGATIVE
Glucose, UA: NEGATIVE mg/dL
Hgb urine dipstick: NEGATIVE
KETONES UR: NEGATIVE mg/dL
NITRITE: NEGATIVE
Protein, ur: NEGATIVE mg/dL
Specific Gravity, Urine: 1.015 (ref 1.005–1.030)
Urobilinogen, UA: 0.2 mg/dL (ref 0.0–1.0)
pH: 5.5 (ref 5.0–8.0)

## 2015-03-11 LAB — WET PREP, GENITAL
Trich, Wet Prep: NONE SEEN
Yeast Wet Prep HPF POC: NONE SEEN

## 2015-03-14 ENCOUNTER — Other Ambulatory Visit: Payer: Self-pay | Admitting: Family

## 2015-03-14 ENCOUNTER — Telehealth: Payer: Self-pay | Admitting: General Practice

## 2015-03-14 DIAGNOSIS — N760 Acute vaginitis: Principal | ICD-10-CM

## 2015-03-14 DIAGNOSIS — B9689 Other specified bacterial agents as the cause of diseases classified elsewhere: Secondary | ICD-10-CM

## 2015-03-14 MED ORDER — METRONIDAZOLE 500 MG PO TABS: 500.0000 mg | ORAL_TABLET | Freq: Two times a day (BID) | ORAL | Status: DC

## 2015-03-14 NOTE — Telephone Encounter (Signed)
Patient informed of BV. No further questions.

## 2015-03-14 NOTE — Telephone Encounter (Signed)
Telephone call to patient regarding + bv, Rx to pharmacy. Called patient, no answer- left message stating we are trying to reach you with results, nothing urgent but please call us back at the clinics.

## 2015-03-15 ENCOUNTER — Ambulatory Visit (INDEPENDENT_AMBULATORY_CARE_PROVIDER_SITE_OTHER): Payer: Self-pay | Admitting: Pediatrics

## 2015-03-15 DIAGNOSIS — Z7681 Expectant parent(s) prebirth pediatrician visit: Secondary | ICD-10-CM

## 2015-03-15 NOTE — Progress Notes (Signed)
Prenatal counseling for impending newborn done-- Z76.81  

## 2015-03-17 ENCOUNTER — Ambulatory Visit (INDEPENDENT_AMBULATORY_CARE_PROVIDER_SITE_OTHER): Payer: Medicaid Other | Admitting: Family

## 2015-03-17 VITALS — BP 123/80 | HR 104 | Temp 98.6°F | Wt 201.4 lb

## 2015-03-17 DIAGNOSIS — Z3493 Encounter for supervision of normal pregnancy, unspecified, third trimester: Secondary | ICD-10-CM

## 2015-03-17 LAB — POCT URINALYSIS DIP (DEVICE)
Bilirubin Urine: NEGATIVE
GLUCOSE, UA: NEGATIVE mg/dL
HGB URINE DIPSTICK: NEGATIVE
Ketones, ur: NEGATIVE mg/dL
NITRITE: NEGATIVE
Protein, ur: NEGATIVE mg/dL
Specific Gravity, Urine: 1.025 (ref 1.005–1.030)
UROBILINOGEN UA: 0.2 mg/dL (ref 0.0–1.0)
pH: 6.5 (ref 5.0–8.0)

## 2015-03-17 NOTE — Progress Notes (Signed)
Stacey Keith  is a 29 y.o. female being seen today for her obstetrical visit. She is at [redacted]w[redacted]d . Patient reports no contractions. Fetal movement: normal.  Joey's a [employer].  Game 6 tonight.    Menstrual History: OB History    Gravida Para Term Preterm AB TAB SAB Ectopic Multiple Living   5 3 3  0 1 1 0 0 0 3      The following portions of the patient's history were reviewed and updated as appropriate: allergies, current medications, past family history, past medical history, past social history, past surgical history and problem list.  Review of Systems Genitourinary: Denies contractions.  Denies vaginal bleeding or leaking of fluid.     Objective:   Filed Vitals:   03/17/15 0855  BP: 123/80  Pulse: 104  Temp: 98.6 F (37 C)    FHT: 142 BPM  Uterine Size: 38 cm  Presentations: cephalic  Pelvic Exam: Declined     Assessment:   G2P0010 at [redacted]w[redacted]d wks IUP Bacterial Vaginosis  Plan:   Plans for delivery: Vaginal anticipated Continue flagyl Counseling: L&D discussion: symptoms of labor and rupture of membranes. Discussed NST at 40 wks  Reviewed kick count information Follow up in 1 Week.    Eino Farber Kennith Gain, CNM

## 2015-03-24 ENCOUNTER — Ambulatory Visit (INDEPENDENT_AMBULATORY_CARE_PROVIDER_SITE_OTHER): Payer: Medicaid Other | Admitting: Obstetrics & Gynecology

## 2015-03-24 VITALS — BP 125/85 | HR 104 | Temp 98.3°F | Wt 203.4 lb

## 2015-03-24 DIAGNOSIS — O0903 Supervision of pregnancy with history of infertility, third trimester: Secondary | ICD-10-CM

## 2015-03-24 LAB — POCT URINALYSIS DIP (DEVICE)
Bilirubin Urine: NEGATIVE
Glucose, UA: NEGATIVE mg/dL
HGB URINE DIPSTICK: NEGATIVE
Ketones, ur: NEGATIVE mg/dL
Nitrite: NEGATIVE
Protein, ur: NEGATIVE mg/dL
Specific Gravity, Urine: 1.02 (ref 1.005–1.030)
UROBILINOGEN UA: 0.2 mg/dL (ref 0.0–1.0)
pH: 7 (ref 5.0–8.0)

## 2015-03-24 NOTE — Progress Notes (Signed)
Subjective:few contractions  Stacey Keith is a 29 y.o. G2P0010 at [redacted]w[redacted]d being seen today for ongoing prenatal care.  Patient reports occasional contractions.  Contractions: Not present.  Vag. Bleeding: None. Movement: Present. Denies leaking of fluid.   The following portions of the patient's history were reviewed and updated as appropriate: allergies, current medications, past family history, past medical history, past social history, past surgical history and problem list.   Objective:   Filed Vitals:   03/24/15 0943  BP: 125/85  Pulse: 104  Temp: 98.3 F (36.8 C)  Weight: 203 lb 6.4 oz (92.262 kg)    Fetal Status: Fetal Heart Rate (bpm): 160   Movement: Present     General:  Alert, oriented and cooperative. Patient is in no acute distress.  Skin: Skin is warm and dry. No rash noted.   Cardiovascular: Normal heart rate noted  Respiratory: Effort and breath sounds normal, no problems with respiration noted  Abdomen: Soft, gravid, appropriate for gestational age. Pain/Pressure: Present     Vaginal: Vag. Bleeding: None.    Vag D/C Character: White  Cervix: Exam revealed        Extremities: Normal range of motion.  Edema: Trace  Mental Status: Normal mood and affect. Normal behavior. Normal judgment and thought content.   Urinalysis:      Assessment and Plan:  Pregnancy: G2P0010 at [redacted]w[redacted]d  There are no diagnoses linked to this encounter.  Term labor symptoms and general obstetric precautions including but not limited to vaginal bleeding, contractions, leaking of fluid and fetal movement were reviewed in detail with the patient.  Please refer to After Visit Summary for other counseling recommendations.   Return in about 1 week (around 03/31/2015).   Adam Phenix, MD 03/24/2015

## 2015-03-24 NOTE — Patient Instructions (Signed)
Parto vaginal (Vaginal Delivery) Durante el parto, el mdico la ayudar a dar a luz a su beb. En elparto vaginal, deber pujar para que el beb salga por la vagina. Sin embargo, antes de que pueda sacar al beb, es necesario que ocurran ciertas cosas. La abertura del tero (cuello del tero) tiene que ablandarse, hacerse ms delgado y abrirse (dilatar) hasta que llegue a 10 cm. Adems, el beb tiene que bajar desde el tero a la vagina. SIGNOS DE TRABAJO DE PARTO  El mdico tendr primero que asegurarse de que usted est en trabajo de parto. Algunos signos son:   Eliminar lo que se llama tapn mucoso antes del inicio del trabajo de parto. Este es una pequea cantidad de mucosidad teida con sangre.  Tener contracciones uterinas regulares y dolorosas.   El tiempo entre las contracciones debe acortarse  Las molestias y el dolor se harn ms intensos gradualmente.  El dolor de las contracciones empeora al caminar y no se alivia con el reposo.   El cuello del tero se hace mas delgado (se borra) y se dilata. ANTES DEL PARTO Una vez que se inicie el trabajo de parto y sea admitida en el hospital o sanatorio, el mdico podr hacer lo siguiente:   Realizar un examen fsico.  Controlar si hay complicaciones relacionadas con el trabajo de parto.  Verificar su presin arterial, temperatura y pulso y la frecuencia cardaca (signos vitales).   Determinar si se ha roto el saco amnitico y cundo ha ocurrido.  Realizar un examen vaginal (utilizando un guante estril y un lubricante) para determinar:  La posicin (presentacin) del beb. El beb se presenta con la cabeza primero (vertex) en el canal de parto (vagina), o estn los pies o las nalgas primero (de nalgas)?  El nivel (estacin) de la cabeza del beb dentro del canal de parto.  El borramiento y la dilatacin del cuello uterino  El monitor fetal electrnico generalmente se coloca sobre el abdomen al llegar. Se utiliza para  controlar las contracciones y la frecuencia cardaca del beb.  Cuando el monitor est en el abdomen (monitor fetal externo), slo toma la frecuencia y la duracin de las contracciones. No informa acerca de la intensidad de las contracciones.  Si el mdico necesita saber exactamente la intensidad de las contracciones o cul es la frecuencia cardaca del beb, colocar un monitor interno en la vagina y el tero. El mdico comentar los riesgos y los beneficios de usar un monitor interno y le pedir autorizacin antes de colocar el dispositivo.  El monitoreo fetal continuo ser necesario si le han aplicado una epidural, si le administran ciertos medicamentos (como oxitocina) y si tiene complicaciones del embarazo o del trabajo de parto.  Podrn colocarle una va intravenosa en una vena del brazo para suministrarle lquidos y medicamentos, si es necesario. TRES ETAPAS DEL TRABAJO DE PARTO Y EL PARTO El trabajo de parto y el parto normales se dividen en tres etapas. Primera etapa Esta etapa comienza cuando comienzan las contracciones regulares y el cuello comienza a borrarse y dilatarse. Finaliza cuando el cuello est completamente abierto (completamente dilatado). La primera etapa es la etapa ms larga del trabajo de parto y puede durar desde 3 horas a 15 horas.  Algunos mtodos estn disponibles para ayudar con el dolor del parto. Usted y su mdico decidirn qu opcin es la mejor para usted. Las opciones incluyen:   Medicamentos narcticos. Estos son medicamentos fuertes que usted puede recibir a travs de una va intravenosa o   como inyeccin en el msculo. Estos medicamentos alivian el dolor pero no hacen que desaparezca completamente.  Epidural. Se administra un medicamento a travs de un tubo delgado que se inserta en la espalda. El medicamento adormece la parte inferior del cuerpo y evita el dolor en esa zona.  Bloqueo paracervical Es una inyeccin de un anestsico en cada lado del cuello  uterino.  Usted podr pedir un parto natural, que implica que no se usen analgsicos ni epidural durante el parto y el trabajo de parto. En cambio, podr tener otro tipo de ayuda como ejercicios respiratorios para hacer frente al dolor. Segunda etapa La segunda etapa del trabajo de parto comienza cuando el cuello se ha dilatado completamente a 10 cm. Contina hasta que usted puja al beb hacia abajo, por el canal de parto, y el beb nace. Esta etapa puede durar slo algunos minutos o algunas horas.  La posicin del la cabeza del beb a medida que pasa por el canal de parto, es informada como un nmero, llamado estacin. Si la cabeza del beb no ha iniciado su descenso, la estacin se describe como que est en menos 3 (-3). Cuando la cabeza del beb est en la estacin cero, est en el medio del canal de parto y se encaja en la pelvis. La estacin en la que se encuentra el beb indica el progreso de la segunda etapa del trabajo de parto.  Cuando el beb nace, el mdico lo sostendr con la cabeza hacia abajo para evitar que el lquido amnitico, el moco y la sangre entren en los pulmones del beb. La boca y la nariz del beb podrn ser succionadas con un pequeo bulbo para retirar todo lquido adicional.  El mdico podr colocar al beb sobre su estmago. Es importante evitar que el beb tome fro. Para hacerlo, el mdico secar al beb, lo colocar directamente sobre su piel, (sin mantas entre usted y el beb) y lo cubrir con mantas secas y tibias.  Se corta el cordn umbilical. Tercera etapa Durante la tercera etapa del trabajo de parto, el mdico sacar la placenta (alumbramiento) y se asegurar de que el sangrado est controlado. La salida de la placenta generalmente demora 5 minutos pero puede tardar hasta 30 minutos. Luego de la salida de la placenta, le darn un medicamento por va intravenosa o inyectable para ayudar a contraer el tero y controlar el sangrado. Si planea amamantar al beb,  puede intentar en este momento Luego de la salida de la placenta, el tero debe contraerse y quedar muy firme. Si el tero no queda firme, el mdico lo masajear. Esto es importante debido a que la contraccin del tero ayuda a cortar el sangrado en el sitio en que la placenta estaba unida al tero. Si el tero no se contrae adecuadamente ni permanece firme, podr causar un sangrado abundante. Si hay mucho sangrado, podrn darle medicamentos para contraer el tero y detener el sangrado.  Document Released: 08/30/2008 Document Revised: 02/01/2014 [employer] Patient Information 2015 [employer]. This information is not intended to replace advice given to you by your health care provider. Make sure you discuss any questions you have with your health care provider.  

## 2015-03-30 ENCOUNTER — Inpatient Hospital Stay (HOSPITAL_COMMUNITY)
Admission: AD | Admit: 2015-03-30 | Payer: Medicaid Other | Source: Ambulatory Visit | Admitting: Obstetrics & Gynecology

## 2015-03-31 ENCOUNTER — Encounter: Payer: Self-pay | Admitting: Family

## 2015-03-31 ENCOUNTER — Ambulatory Visit (INDEPENDENT_AMBULATORY_CARE_PROVIDER_SITE_OTHER): Payer: Medicaid Other | Admitting: Family

## 2015-03-31 VITALS — BP 136/80 | HR 93 | Temp 98.6°F | Wt 204.4 lb

## 2015-03-31 DIAGNOSIS — O480 Post-term pregnancy: Secondary | ICD-10-CM

## 2015-03-31 LAB — POCT URINALYSIS DIP (DEVICE)
BILIRUBIN URINE: NEGATIVE
Glucose, UA: NEGATIVE mg/dL
Hgb urine dipstick: NEGATIVE
KETONES UR: NEGATIVE mg/dL
NITRITE: NEGATIVE
PROTEIN: NEGATIVE mg/dL
Specific Gravity, Urine: 1.02 (ref 1.005–1.030)
UROBILINOGEN UA: 0.2 mg/dL (ref 0.0–1.0)
pH: 5.5 (ref 5.0–8.0)

## 2015-03-31 NOTE — Progress Notes (Signed)
Stacey BeanMelinda Keith  is a 29 y.o.  female being seen today for her obstetrical visit. She is at 3338w1d  gestation. Patient reports occasional contractions. Fetal movement: normal.  Menstrual History: OB History    Gravida Para Term Preterm AB TAB SAB Ectopic Multiple Living   5 3 3  0 1 1 0 0 0 3      The following portions of the patient's history were reviewed and updated as appropriate: allergies, current medications, past family history, past medical history, past social history, past surgical history and problem list.  Review of Systems Genitourinary: Denies vaginal bleeding or leaking of fluid.     Objective:    Filed Vitals:   03/31/15 1107  BP: 136/80  Pulse: 93  Temp: 98.6 F (37 C)    FHT:  150 BPM  Uterine Size: 39 cm  Presentations: cephalic  Pelvic Exam: Not examined (per pt request)     Assessment:   29 y.o. G2P0010 at 8038w1d wks   Plan:    Postdates management: discussed fetal surveillance and induction. Induction: cervical ripening agents, foley bulb.  Follow up in BPP scheduled for 04/01/15 IOL for 41 wks Reviewed fetal kick counts

## 2015-03-31 NOTE — Progress Notes (Signed)
BPP scheduled for 04/01/2015 @ 2:30 PM. Induction scheduled for 04/07/2015 @ 7:00 AM.

## 2015-03-31 NOTE — Progress Notes (Signed)
Edema- feet   Pain/pressure- lower abd, "shooting pain"

## 2015-04-01 ENCOUNTER — Encounter (HOSPITAL_COMMUNITY): Payer: Self-pay | Admitting: *Deleted

## 2015-04-01 ENCOUNTER — Ambulatory Visit (HOSPITAL_COMMUNITY): Payer: Medicaid Other

## 2015-04-01 ENCOUNTER — Telehealth (HOSPITAL_COMMUNITY): Payer: Self-pay | Admitting: *Deleted

## 2015-04-01 ENCOUNTER — Ambulatory Visit (HOSPITAL_COMMUNITY)
Admission: RE | Admit: 2015-04-01 | Discharge: 2015-04-01 | Disposition: A | Discharge status: Home / Self Care | Payer: Medicaid Other | Source: Ambulatory Visit | Attending: Family | Admitting: Family

## 2015-04-01 ENCOUNTER — Ambulatory Visit (HOSPITAL_COMMUNITY): Admission: RE | Admit: 2015-04-01 | Payer: Medicaid Other | Source: Ambulatory Visit

## 2015-04-01 DIAGNOSIS — Z3A40 40 weeks gestation of pregnancy: Secondary | ICD-10-CM | POA: Diagnosis not present

## 2015-04-01 DIAGNOSIS — O480 Post-term pregnancy: Secondary | ICD-10-CM | POA: Insufficient documentation

## 2015-04-01 DIAGNOSIS — N61 Mastitis without abscess: Secondary | ICD-10-CM

## 2015-04-01 HISTORY — DX: Mastitis without abscess: N61.0

## 2015-04-01 NOTE — Telephone Encounter (Signed)
Preadmission screen  

## 2015-04-06 ENCOUNTER — Ambulatory Visit (INDEPENDENT_AMBULATORY_CARE_PROVIDER_SITE_OTHER): Payer: Medicaid Other | Admitting: *Deleted

## 2015-04-06 VITALS — BP 124/86 | HR 94

## 2015-04-06 DIAGNOSIS — O480 Post-term pregnancy: Secondary | ICD-10-CM

## 2015-04-06 LAB — POCT URINALYSIS DIP (DEVICE)
Bilirubin Urine: NEGATIVE
Glucose, UA: NEGATIVE mg/dL
Hgb urine dipstick: NEGATIVE
Ketones, ur: NEGATIVE mg/dL
NITRITE: NEGATIVE
PROTEIN: NEGATIVE mg/dL
Specific Gravity, Urine: 1.025 (ref 1.005–1.030)
UROBILINOGEN UA: 0.2 mg/dL (ref 0.0–1.0)
pH: 6 (ref 5.0–8.0)

## 2015-04-06 NOTE — Progress Notes (Signed)
NST reviewed and reactive.  Jalyn Rosero L. Harraway-Smith, M.D., FACOG    

## 2015-04-06 NOTE — Progress Notes (Signed)
Pt seen @ [facility] on 7/1 for BPP - 8/8 and AFI - 14.93 cm.  IOL tomorrow @ 0700.

## 2015-04-06 NOTE — Addendum Note (Signed)
Addended by: Jill SideAY, DIANE L on: 04/06/2015 03:20 PM   Modules accepted: Level of Service

## 2015-04-07 ENCOUNTER — Encounter (HOSPITAL_COMMUNITY)
Admission: RE | Disposition: A | Discharge status: Home / Self Care | Payer: Self-pay | Source: Ambulatory Visit | Attending: Family Medicine

## 2015-04-07 ENCOUNTER — Encounter (HOSPITAL_COMMUNITY): Payer: Self-pay

## 2015-04-07 ENCOUNTER — Inpatient Hospital Stay (HOSPITAL_COMMUNITY)
Admission: RE | Admit: 2015-04-07 | Discharge: 2015-04-09 | DRG: 766 | Disposition: A | Discharge status: Home / Self Care | Payer: Medicaid Other | Source: Ambulatory Visit | Attending: Family Medicine | Admitting: Family Medicine

## 2015-04-07 ENCOUNTER — Inpatient Hospital Stay (HOSPITAL_COMMUNITY): Payer: Medicaid Other | Admitting: Anesthesiology

## 2015-04-07 VITALS — BP 114/68 | HR 100 | Temp 98.2°F | Resp 18 | Ht 62.0 in | Wt 205.0 lb

## 2015-04-07 DIAGNOSIS — O9962 Diseases of the digestive system complicating childbirth: Secondary | ICD-10-CM | POA: Diagnosis present

## 2015-04-07 DIAGNOSIS — O323XX0 Maternal care for face, brow and chin presentation, not applicable or unspecified: Principal | ICD-10-CM | POA: Diagnosis present

## 2015-04-07 DIAGNOSIS — O480 Post-term pregnancy: Secondary | ICD-10-CM | POA: Diagnosis present

## 2015-04-07 DIAGNOSIS — K589 Irritable bowel syndrome without diarrhea: Secondary | ICD-10-CM | POA: Diagnosis present

## 2015-04-07 DIAGNOSIS — Z283 Underimmunization status: Secondary | ICD-10-CM

## 2015-04-07 DIAGNOSIS — O9081 Anemia of the puerperium: Secondary | ICD-10-CM | POA: Diagnosis present

## 2015-04-07 DIAGNOSIS — Z6837 Body mass index (BMI) 37.0-37.9, adult: Secondary | ICD-10-CM

## 2015-04-07 DIAGNOSIS — O0903 Supervision of pregnancy with history of infertility, third trimester: Secondary | ICD-10-CM

## 2015-04-07 DIAGNOSIS — D649 Anemia, unspecified: Secondary | ICD-10-CM | POA: Diagnosis present

## 2015-04-07 DIAGNOSIS — O99214 Obesity complicating childbirth: Secondary | ICD-10-CM | POA: Diagnosis present

## 2015-04-07 DIAGNOSIS — Z3A41 41 weeks gestation of pregnancy: Secondary | ICD-10-CM | POA: Diagnosis present

## 2015-04-07 DIAGNOSIS — E669 Obesity, unspecified: Secondary | ICD-10-CM | POA: Diagnosis present

## 2015-04-07 DIAGNOSIS — Z2839 Other underimmunization status: Secondary | ICD-10-CM

## 2015-04-07 DIAGNOSIS — O9989 Other specified diseases and conditions complicating pregnancy, childbirth and the puerperium: Secondary | ICD-10-CM

## 2015-04-07 DIAGNOSIS — O09899 Supervision of other high risk pregnancies, unspecified trimester: Secondary | ICD-10-CM

## 2015-04-07 LAB — ABO/RH: ABO/RH(D): A POS

## 2015-04-07 LAB — RPR: RPR: NONREACTIVE

## 2015-04-07 LAB — TYPE AND SCREEN
ABO/RH(D): A POS
Antibody Screen: NEGATIVE

## 2015-04-07 LAB — CBC
HCT: 31.4 % — ABNORMAL LOW (ref 36.0–46.0)
Hemoglobin: 9.5 g/dL — ABNORMAL LOW (ref 12.0–15.0)
MCH: 23.8 pg — AB (ref 26.0–34.0)
MCHC: 30.3 g/dL (ref 30.0–36.0)
MCV: 78.7 fL (ref 78.0–100.0)
PLATELETS: 309 10*3/uL (ref 150–400)
RBC: 3.99 MIL/uL (ref 3.87–5.11)
RDW: 16.4 % — ABNORMAL HIGH (ref 11.5–15.5)
WBC: 8.9 10*3/uL (ref 4.0–10.5)

## 2015-04-07 SURGERY — Surgical Case
Anesthesia: Epidural

## 2015-04-07 MED ORDER — OXYTOCIN BOLUS FROM INFUSION: 500.0000 mL | INTRAVENOUS | Status: DC

## 2015-04-07 MED ORDER — SENNOSIDES-DOCUSATE SODIUM 8.6-50 MG PO TABS
2.0000 | ORAL_TABLET | ORAL | Status: DC
  Administered 2015-04-08: 2 via ORAL
  Filled 2015-04-07: qty 2

## 2015-04-07 MED ORDER — MORPHINE SULFATE (PF) 0.5 MG/ML IJ SOLN
INTRAMUSCULAR | Status: DC | PRN
  Administered 2015-04-07: 3 mg via EPIDURAL

## 2015-04-07 MED ORDER — WITCH HAZEL-GLYCERIN EX PADS: 1.0000 "application " | MEDICATED_PAD | CUTANEOUS | Status: DC | PRN

## 2015-04-07 MED ORDER — KETOROLAC TROMETHAMINE 30 MG/ML IJ SOLN
30.0000 mg | Freq: Four times a day (QID) | INTRAMUSCULAR | Status: DC | PRN
  Administered 2015-04-07: 30 mg via INTRAMUSCULAR

## 2015-04-07 MED ORDER — DIPHENHYDRAMINE HCL 50 MG/ML IJ SOLN: 12.5000 mg | INTRAMUSCULAR | Status: DC | PRN

## 2015-04-07 MED ORDER — ONDANSETRON HCL 4 MG/2ML IJ SOLN
INTRAMUSCULAR | Status: DC | PRN
  Administered 2015-04-07: 4 mg via INTRAVENOUS

## 2015-04-07 MED ORDER — CEFAZOLIN SODIUM-DEXTROSE 2-3 GM-% IV SOLR
INTRAVENOUS | Status: DC | PRN
  Administered 2015-04-07: 2 g via INTRAVENOUS

## 2015-04-07 MED ORDER — LACTATED RINGERS IV SOLN
500.0000 mL | INTRAVENOUS | Status: DC | PRN
  Administered 2015-04-07: 500 mL via INTRAVENOUS

## 2015-04-07 MED ORDER — DIBUCAINE 1 % RE OINT: 1.0000 "application " | TOPICAL_OINTMENT | RECTAL | Status: DC | PRN

## 2015-04-07 MED ORDER — LACTATED RINGERS IV SOLN
INTRAVENOUS | Status: DC
  Administered 2015-04-07: 19:00:00 via INTRAVENOUS

## 2015-04-07 MED ORDER — PHENYLEPHRINE HCL 10 MG/ML IJ SOLN
INTRAMUSCULAR | Status: DC | PRN
  Administered 2015-04-07: 80 ug via INTRAVENOUS

## 2015-04-07 MED ORDER — NALBUPHINE HCL 10 MG/ML IJ SOLN: 5.0000 mg | INTRAMUSCULAR | Status: DC | PRN

## 2015-04-07 MED ORDER — IBUPROFEN 600 MG PO TABS: 600.0000 mg | ORAL_TABLET | Freq: Four times a day (QID) | ORAL | Status: DC | PRN

## 2015-04-07 MED ORDER — NALBUPHINE HCL 10 MG/ML IJ SOLN: 5.0000 mg | Freq: Once | INTRAMUSCULAR | Status: DC | PRN

## 2015-04-07 MED ORDER — FENTANYL CITRATE (PF) 100 MCG/2ML IJ SOLN: 50.0000 ug | INTRAMUSCULAR | Status: DC | PRN

## 2015-04-07 MED ORDER — OXYCODONE-ACETAMINOPHEN 5-325 MG PO TABS: 2.0000 | ORAL_TABLET | ORAL | Status: DC | PRN

## 2015-04-07 MED ORDER — MORPHINE SULFATE 0.5 MG/ML IJ SOLN
INTRAMUSCULAR | Status: AC
  Filled 2015-04-07: qty 10

## 2015-04-07 MED ORDER — ONDANSETRON HCL 4 MG/2ML IJ SOLN: 4.0000 mg | Freq: Three times a day (TID) | INTRAMUSCULAR | Status: DC | PRN

## 2015-04-07 MED ORDER — SCOPOLAMINE 1 MG/3DAYS TD PT72: 1.0000 | MEDICATED_PATCH | Freq: Once | TRANSDERMAL | Status: DC

## 2015-04-07 MED ORDER — OXYTOCIN 40 UNITS IN LACTATED RINGERS INFUSION - SIMPLE MED
1.0000 m[IU]/min | INTRAVENOUS | Status: DC
  Administered 2015-04-07: 2 m[IU]/min via INTRAVENOUS
  Filled 2015-04-07: qty 1000

## 2015-04-07 MED ORDER — PRENATAL MULTIVITAMIN CH
1.0000 | ORAL_TABLET | Freq: Every day | ORAL | Status: DC
  Administered 2015-04-08 – 2015-04-09 (×2): 1 via ORAL
  Filled 2015-04-07 (×2): qty 1

## 2015-04-07 MED ORDER — DIPHENHYDRAMINE HCL 25 MG PO CAPS: 25.0000 mg | ORAL_CAPSULE | ORAL | Status: DC | PRN

## 2015-04-07 MED ORDER — PHENYLEPHRINE 40 MCG/ML (10ML) SYRINGE FOR IV PUSH (FOR BLOOD PRESSURE SUPPORT)
PREFILLED_SYRINGE | INTRAVENOUS | Status: AC
  Filled 2015-04-07: qty 10

## 2015-04-07 MED ORDER — OXYTOCIN 40 UNITS IN LACTATED RINGERS INFUSION - SIMPLE MED: 62.5000 mL/h | INTRAVENOUS | Status: DC

## 2015-04-07 MED ORDER — IBUPROFEN 600 MG PO TABS
600.0000 mg | ORAL_TABLET | Freq: Four times a day (QID) | ORAL | Status: DC
  Administered 2015-04-08 – 2015-04-09 (×6): 600 mg via ORAL
  Filled 2015-04-07 (×6): qty 1

## 2015-04-07 MED ORDER — EPHEDRINE 5 MG/ML INJ: 10.0000 mg | INTRAVENOUS | Status: DC | PRN

## 2015-04-07 MED ORDER — ONDANSETRON HCL 4 MG/2ML IJ SOLN: 4.0000 mg | Freq: Four times a day (QID) | INTRAMUSCULAR | Status: DC | PRN

## 2015-04-07 MED ORDER — DIPHENHYDRAMINE HCL 25 MG PO CAPS
25.0000 mg | ORAL_CAPSULE | Freq: Four times a day (QID) | ORAL | Status: DC | PRN
  Administered 2015-04-08: 25 mg via ORAL
  Filled 2015-04-07: qty 1

## 2015-04-07 MED ORDER — ZOLPIDEM TARTRATE 5 MG PO TABS: 5.0000 mg | ORAL_TABLET | Freq: Every evening | ORAL | Status: DC | PRN

## 2015-04-07 MED ORDER — KETOROLAC TROMETHAMINE 30 MG/ML IJ SOLN: 30.0000 mg | Freq: Four times a day (QID) | INTRAMUSCULAR | Status: DC | PRN

## 2015-04-07 MED ORDER — LANOLIN HYDROUS EX OINT: 1.0000 "application " | TOPICAL_OINTMENT | CUTANEOUS | Status: DC | PRN

## 2015-04-07 MED ORDER — LIDOCAINE HCL (PF) 1 % IJ SOLN
INTRAMUSCULAR | Status: DC | PRN
  Administered 2015-04-07 (×2): 4 mL via EPIDURAL

## 2015-04-07 MED ORDER — FENTANYL CITRATE (PF) 100 MCG/2ML IJ SOLN: 25.0000 ug | INTRAMUSCULAR | Status: DC | PRN

## 2015-04-07 MED ORDER — ACETAMINOPHEN 325 MG PO TABS: 650.0000 mg | ORAL_TABLET | ORAL | Status: DC | PRN

## 2015-04-07 MED ORDER — PHENYLEPHRINE 40 MCG/ML (10ML) SYRINGE FOR IV PUSH (FOR BLOOD PRESSURE SUPPORT)
80.0000 ug | PREFILLED_SYRINGE | INTRAVENOUS | Status: DC | PRN
  Filled 2015-04-07 (×2): qty 20

## 2015-04-07 MED ORDER — NALOXONE HCL 0.4 MG/ML IJ SOLN: 0.4000 mg | INTRAMUSCULAR | Status: DC | PRN

## 2015-04-07 MED ORDER — MEPERIDINE HCL 25 MG/ML IJ SOLN: 6.2500 mg | INTRAMUSCULAR | Status: DC | PRN

## 2015-04-07 MED ORDER — KETOROLAC TROMETHAMINE 30 MG/ML IJ SOLN
INTRAMUSCULAR | Status: AC
  Filled 2015-04-07: qty 1

## 2015-04-07 MED ORDER — CEFAZOLIN SODIUM-DEXTROSE 2-3 GM-% IV SOLR
INTRAVENOUS | Status: AC
  Filled 2015-04-07: qty 50

## 2015-04-07 MED ORDER — NALOXONE HCL 1 MG/ML IJ SOLN: 1.0000 ug/kg/h | INTRAVENOUS | Status: DC | PRN

## 2015-04-07 MED ORDER — TETANUS-DIPHTH-ACELL PERTUSSIS 5-2.5-18.5 LF-MCG/0.5 IM SUSP: 0.5000 mL | Freq: Once | INTRAMUSCULAR | Status: DC

## 2015-04-07 MED ORDER — SODIUM BICARBONATE 8.4 % IV SOLN
INTRAVENOUS | Status: DC | PRN
  Administered 2015-04-07 (×3): 5 mL via EPIDURAL

## 2015-04-07 MED ORDER — ONDANSETRON HCL 4 MG/2ML IJ SOLN
INTRAMUSCULAR | Status: AC
  Filled 2015-04-07: qty 2

## 2015-04-07 MED ORDER — TERBUTALINE SULFATE 1 MG/ML IJ SOLN
INTRAMUSCULAR | Status: AC
  Filled 2015-04-07: qty 1

## 2015-04-07 MED ORDER — PHENYLEPHRINE 40 MCG/ML (10ML) SYRINGE FOR IV PUSH (FOR BLOOD PRESSURE SUPPORT): 80.0000 ug | PREFILLED_SYRINGE | INTRAVENOUS | Status: DC | PRN

## 2015-04-07 MED ORDER — LIDOCAINE-EPINEPHRINE (PF) 2 %-1:200000 IJ SOLN
INTRAMUSCULAR | Status: AC
  Filled 2015-04-07: qty 20

## 2015-04-07 MED ORDER — NALBUPHINE HCL 10 MG/ML IJ SOLN
5.0000 mg | INTRAMUSCULAR | Status: DC | PRN
  Administered 2015-04-08: 5 mg via SUBCUTANEOUS
  Filled 2015-04-07: qty 1

## 2015-04-07 MED ORDER — SIMETHICONE 80 MG PO CHEW
80.0000 mg | CHEWABLE_TABLET | ORAL | Status: DC
  Administered 2015-04-08: 80 mg via ORAL
  Filled 2015-04-07: qty 1

## 2015-04-07 MED ORDER — FENTANYL 2.5 MCG/ML BUPIVACAINE 1/10 % EPIDURAL INFUSION (WH - ANES)
12.0000 mL/h | INTRAMUSCULAR | Status: DC | PRN
  Administered 2015-04-07: 13 mL/h via EPIDURAL

## 2015-04-07 MED ORDER — OXYTOCIN 10 UNIT/ML IJ SOLN
INTRAMUSCULAR | Status: AC
  Filled 2015-04-07: qty 4

## 2015-04-07 MED ORDER — SCOPOLAMINE 1 MG/3DAYS TD PT72
MEDICATED_PATCH | TRANSDERMAL | Status: DC | PRN
  Administered 2015-04-07: 1 via TRANSDERMAL

## 2015-04-07 MED ORDER — LIDOCAINE HCL (PF) 1 % IJ SOLN: 30.0000 mL | INTRAMUSCULAR | Status: DC | PRN

## 2015-04-07 MED ORDER — FENTANYL 2.5 MCG/ML BUPIVACAINE 1/10 % EPIDURAL INFUSION (WH - ANES)
14.0000 mL/h | INTRAMUSCULAR | Status: DC | PRN
  Filled 2015-04-07: qty 125

## 2015-04-07 MED ORDER — SODIUM BICARBONATE 8.4 % IV SOLN
INTRAVENOUS | Status: AC
  Filled 2015-04-07: qty 50

## 2015-04-07 MED ORDER — OXYTOCIN 40 UNITS IN LACTATED RINGERS INFUSION - SIMPLE MED: 62.5000 mL/h | INTRAVENOUS | Status: AC

## 2015-04-07 MED ORDER — SIMETHICONE 80 MG PO CHEW
80.0000 mg | CHEWABLE_TABLET | Freq: Three times a day (TID) | ORAL | Status: DC
  Administered 2015-04-08 – 2015-04-09 (×4): 80 mg via ORAL
  Filled 2015-04-07 (×4): qty 1

## 2015-04-07 MED ORDER — OXYCODONE-ACETAMINOPHEN 5-325 MG PO TABS: 1.0000 | ORAL_TABLET | ORAL | Status: DC | PRN

## 2015-04-07 MED ORDER — TERBUTALINE SULFATE 1 MG/ML IJ SOLN
0.2500 mg | Freq: Once | INTRAMUSCULAR | Status: AC | PRN
  Administered 2015-04-07: 0.25 mg via SUBCUTANEOUS

## 2015-04-07 MED ORDER — OXYTOCIN 10 UNIT/ML IJ SOLN
40.0000 [IU] | INTRAMUSCULAR | Status: DC | PRN
  Administered 2015-04-07: 40 [IU] via INTRAVENOUS

## 2015-04-07 MED ORDER — SCOPOLAMINE 1 MG/3DAYS TD PT72
MEDICATED_PATCH | TRANSDERMAL | Status: AC
  Filled 2015-04-07: qty 1

## 2015-04-07 MED ORDER — SIMETHICONE 80 MG PO CHEW: 80.0000 mg | CHEWABLE_TABLET | ORAL | Status: DC | PRN

## 2015-04-07 MED ORDER — LACTATED RINGERS IV SOLN
INTRAVENOUS | Status: DC
  Administered 2015-04-07: 16:00:00 via INTRAUTERINE

## 2015-04-07 MED ORDER — SODIUM CHLORIDE 0.9 % IJ SOLN: 3.0000 mL | INTRAMUSCULAR | Status: DC | PRN

## 2015-04-07 MED ORDER — CITRIC ACID-SODIUM CITRATE 334-500 MG/5ML PO SOLN
30.0000 mL | ORAL | Status: DC | PRN
  Administered 2015-04-07: 30 mL via ORAL
  Filled 2015-04-07: qty 15

## 2015-04-07 MED ORDER — LACTATED RINGERS IV SOLN
INTRAVENOUS | Status: DC
  Administered 2015-04-08: 02:00:00 via INTRAVENOUS

## 2015-04-07 MED ORDER — MENTHOL 3 MG MT LOZG: 1.0000 | LOZENGE | OROMUCOSAL | Status: DC | PRN

## 2015-04-07 SURGICAL SUPPLY — 28 items
BENZOIN TINCTURE PRP APPL 2/3 (GAUZE/BANDAGES/DRESSINGS) ×2 IMPLANT
CLAMP CORD UMBIL (MISCELLANEOUS) IMPLANT
CLOTH BEACON ORANGE TIMEOUT ST (SAFETY) ×2 IMPLANT
DRAPE SHEET LG 3/4 BI-LAMINATE (DRAPES) IMPLANT
DRSG OPSITE POSTOP 4X10 (GAUZE/BANDAGES/DRESSINGS) ×2 IMPLANT
DURAPREP 26ML APPLICATOR (WOUND CARE) ×2 IMPLANT
ELECT REM PT RETURN 9FT ADLT (ELECTROSURGICAL) ×2
ELECTRODE REM PT RTRN 9FT ADLT (ELECTROSURGICAL) ×1 IMPLANT
EXTRACTOR VACUUM M CUP 4 TUBE (SUCTIONS) IMPLANT
GAUZE SPONGE 4X4 12PLY STRL (GAUZE/BANDAGES/DRESSINGS) ×2 IMPLANT
GLOVE BIOGEL PI IND STRL 7.0 (GLOVE) ×1 IMPLANT
GLOVE BIOGEL PI INDICATOR 7.0 (GLOVE) ×1
GLOVE ECLIPSE 7.0 STRL STRAW (GLOVE) ×4 IMPLANT
GOWN STRL REUS W/TWL LRG LVL3 (GOWN DISPOSABLE) ×4 IMPLANT
KIT ABG SYR 3ML LUER SLIP (SYRINGE) IMPLANT
NEEDLE HYPO 22GX1.5 SAFETY (NEEDLE) ×2 IMPLANT
NEEDLE HYPO 25X5/8 SAFETYGLIDE (NEEDLE) IMPLANT
NS IRRIG 1000ML POUR BTL (IV SOLUTION) ×2 IMPLANT
PACK C SECTION WH (CUSTOM PROCEDURE TRAY) ×2 IMPLANT
PAD OB MATERNITY 4.3X12.25 (PERSONAL CARE ITEMS) ×2 IMPLANT
RTRCTR C-SECT PINK 25CM LRG (MISCELLANEOUS) ×2 IMPLANT
STRIP CLOSURE SKIN 1/2X4 (GAUZE/BANDAGES/DRESSINGS) ×2 IMPLANT
SUT VIC AB 0 CTX 36 (SUTURE) ×3
SUT VIC AB 0 CTX36XBRD ANBCTRL (SUTURE) ×3 IMPLANT
SUT VIC AB 4-0 KS 27 (SUTURE) IMPLANT
SYR 30ML LL (SYRINGE) ×2 IMPLANT
TOWEL OR 17X24 6PK STRL BLUE (TOWEL DISPOSABLE) ×2 IMPLANT
TRAY FOLEY CATH SILVER 14FR (SET/KITS/TRAYS/PACK) ×2 IMPLANT

## 2015-04-07 NOTE — Anesthesia Procedure Notes (Addendum)
Epidural Patient location during procedure: OB Start time: 04/07/2015 3:13 PM  Staffing Anesthesiologist: Mal AmabileFOSTER, Memorie Yokoyama Performed by: anesthesiologist   Preanesthetic Checklist Completed: patient identified, site marked, surgical consent, pre-op evaluation, timeout performed, IV checked, risks and benefits discussed and monitors and equipment checked  Epidural Patient position: sitting Prep: site prepped and draped and DuraPrep Patient monitoring: continuous pulse ox and blood pressure Approach: midline Location: L3-L4 Injection technique: LOR air  Needle:  Needle type: Tuohy  Needle gauge: 17 G Needle length: 9 cm and 9 Needle insertion depth: 6 cm Catheter type: closed end flexible Catheter size: 19 Gauge Catheter at skin depth: 11 cm Test dose: negative and Other  Assessment Events: blood not aspirated, injection not painful, no injection resistance, negative IV test and no paresthesia  Additional Notes Patient identified. Risks and benefits discussed including failed block, incomplete  Pain control, post dural puncture headache, nerve damage, paralysis, blood pressure Changes, nausea, vomiting, reactions to medications-both toxic and allergic and post Partum back pain. All questions were answered. Patient expressed understanding and wished to proceed. Sterile technique was used throughout procedure. Epidural site was Dressed with sterile barrier dressing. No paresthesias, signs of intravascular injection Or signs of intrathecal spread were encountered.  Patient was more comfortable after the epidural was dosed. Please see RN's note for documentation of vital signs and FHR which are stable.

## 2015-04-07 NOTE — H&P (Signed)
LABOR ADMISSION HISTORY AND PHYSICAL  Stacey Keith is a 29 y.o. female G2P0010 with IUP at [redacted]w[redacted]d by LMP c/w 8 wk sono presenting for IOL 2/2 postdates. She reports +FMs, No LOF, no VB, no blurry vision, headaches or peripheral edema, and RUQ pain.  She plans on breast feeding. She request condoms for birth control.  Dating: By LMP c/w 8 wk sono --->  Estimated Date of Delivery: 03/30/15  Past Medical History: Past Medical History  Diagnosis Date  . Medical history non-contributory     Past Surgical History: Past Surgical History  Procedure Laterality Date  . Uterine septum resection  04/27/2014    Obstetrical History: OB History    Gravida Para Term Preterm AB TAB SAB Ectopic Multiple Living   2    1 0 1 0 0 0      Social History: History   Social History  . Marital Status: Married    Spouse Name: N/A  . Number of Children: N/A  . Years of Education: N/A   Social History Main Topics  . Smoking status: Never Smoker   . Smokeless tobacco: Never Used  . Alcohol Use: No  . Drug Use: No  . Sexual Activity: Yes    Birth Control/ Protection: None   Other Topics Concern  . None   Social History Narrative    Family History: Family History  Problem Relation Age of Onset  . Breast cancer Paternal Grandmother     Allergies: No Known Allergies  Prescriptions prior to admission  Medication Sig Dispense Refill Last Dose  . Prenatal Vit-Fe Fumarate-FA (PRENATAL MULTIVITAMIN) TABS tablet Take 1 tablet by mouth daily at 12 noon.   04/06/2015 at Unknown time     Review of Systems   All systems reviewed and negative except as stated in HPI  Height  (1.575 m), weight 205 lb (92.987 kg), last menstrual period 06/23/2014. General appearance: alert and cooperative Lungs: clear to auscultation bilaterally Heart: regular rate and rhythm Abdomen: soft, non-tender; bowel sounds normal Pelvic: 4/80/-3 very posterior cervix Extremities: Homans sign is negative, no sign of  DVT Presentation: cephalic Fetal monitoringBaseline: 135 bpm, Variability: Good {> 6 bpm), Accelerations: none and Decelerations: Absent Uterine activity: irregular every 4-6 min   Prenatal labs: ABO, Rh: A/Positive/-- (12/03 0000) Antibody: Negative (12/03 0000) Rubella: 0.31 (01/13 1619) RPR: NON REAC (04/07 1237)  HBsAg: NEGATIVE (01/13 1619)  HIV: NONREACTIVE (04/07 1237)  GBS: Negative (06/02 0000)  1 hr Glucola 144, normal 3 hr Genetic screening  normal Anatomy US low lying placenta at 19 wks, resolved at 25 wks   [facility] Prenatal Labs   Dating First trimester ultrasound Blood type: A/Positive/-- (12/03 0000)  Genetic Screen Quad: Neg  Antibody:Negative (12/03 0000)  Anatomic [place] Low-lying placenta; otherwise normal, but limited at 19 wks > rescan at 25 wks resolved Rubella:   GTT Third trimester: 144 Normal 3 hour RPR: Nonreactive (12/03 0000)   TDaP vaccine 01/06/2015 HBsAg: NEGATIVE (01/13 1619)   Flu vaccine Decline HIV: Non-reactive (12/03 0000)   GBS negative GBS: Neg  Contraception  Pap:05/2013 neg  Baby Food Breastfeed   Circumcision yes   Pediatrician interviewing ped next week - [place] [facility]   Support Person Joey - Husband       Prenatal Transfer Tool  Maternal Diabetes: No Genetic Screening: Normal Maternal Ultrasounds/Referrals: Normal Fetal Ultrasounds or other Referrals:  Referred to Materal Fetal Medicine  Maternal Substance Abuse:  No Significant Maternal Medications:  None Significant Maternal  Lab Results: Lab values include: Group B Strep negative  Results for orders placed or performed during the hospital encounter of 04/07/15 (from the past 24 hour(s))  CBC   Collection Time: 04/07/15  8:15 AM  Result Value Ref Range   WBC 8.9 4.0 - 10.5 K/uL   RBC 3.99 3.87 - 5.11 MIL/uL   Hemoglobin 9.5 (L) 12.0 - 15.0 g/dL   HCT 09.831.4 (L) 11.936.0 - 14.746.0 %   MCV 78.7 78.0 - 100.0 fL   MCH 23.8 (L) 26.0 - 34.0 pg   MCHC 30.3 30.0 - 36.0  g/dL   RDW 82.916.4 (H) 56.211.5 - 13.015.5 %   Platelets 309 150 - 400 K/uL    Patient Active Problem List   Diagnosis Date Noted  . Post term pregnancy, 41 weeks 04/07/2015  . Post term pregnancy over 40 weeks   . [redacted] weeks gestation of pregnancy   . Irritable bowel syndrome affecting pregnancy, antepartum 01/27/2015  . Uterine congenital anomaly, antepartum   . Rubella non-immune status, antepartum 10/23/2014  . Supervision of high-risk pregnancy with history of infertility 10/12/2014  . Septate uterus, antepartum 10/12/2014  . Infertility female 06/27/2013  . Oligomenorrhea 05/23/2013  . Hirsutism 05/23/2013    Assessment: Stacey Keith is a 29 y.o. G2P0010 at 7137w1d here for IOL 2/2 postdates.   #Labor:augment with pitocin   #Pain: Epidural upon request #FWB: Cat I #ID:  GBS negative #MOF: breast #MOC:condoms   De HollingsheadCatherine L Wallace 04/07/2015, 9:49 AM  I was present for the exam and agree with above.  [place] Jeromie Gainor, [place] 04/07/2015 4:45 PM

## 2015-04-07 NOTE — Progress Notes (Signed)
Patient ID: Stacey Keith Scarantino, female   DOB: 06/27/1986, 29 y.o.   MRN: [id number] Stacey Keith Arvidson is a 29 y.o. G2P0010 at 7829w1d.  Subjective: Pt SROM's moderate clear fluid. Reports contractions much stronger. Requesting epidural.   Objective: Heart Rate 95 RR 18 BP 132/85 mmHg    FHT:  FHR: 125 bpm, variability: mod,  accelerations:  15x15,  decelerations:  Repetitive mild-mod variables.  UC:   Q 2 minutes, moderate-strong  Dilation: 7.5 Effacement (%): 90 Cervical Position: Posterior Station: -2 Presentation: Vertex Exam by:: J.Thornton, RN  Labs: Results for orders placed or performed during the hospital encounter of 04/07/15 (from the past 24 hour(s))  CBC     Status: Abnormal   Collection Time: 04/07/15  8:15 AM  Result Value Ref Range   WBC 8.9 4.0 - 10.5 K/uL   RBC 3.99 3.87 - 5.11 MIL/uL   Hemoglobin 9.5 (L) 12.0 - 15.0 g/dL   HCT 74.231.4 (L) 59.536.0 - 63.846.0 %   MCV 78.7 78.0 - 100.0 fL   MCH 23.8 (L) 26.0 - 34.0 pg   MCHC 30.3 30.0 - 36.0 g/dL   RDW 75.616.4 (H) 43.311.5 - 29.515.5 %   Platelets 309 150 - 400 K/uL  RPR     Status: None   Collection Time: 04/07/15  8:15 AM  Result Value Ref Range   RPR Ser Ql Non Reactive Non Reactive  Type and screen     Status: None   Collection Time: 04/07/15 10:08 AM  Result Value Ref Range   ABO/RH(D) A POS    Antibody Screen NEG    Sample Expiration 04/10/2015   ABO/Rh     Status: None   Collection Time: 04/07/15 10:08 AM  Result Value Ref Range   ABO/RH(D) A POS     Assessment / Plan: 3329w1d week IUP Labor: transition Fetal Wellbeing:  Category II Pain Control:  Epidrural Anticipated MOD:  SVD Try position changes, fluid bolus for variables. May need amnioinfusion.   [place] Frankie Zito, [place] 04/07/2015 4:54 PM

## 2015-04-07 NOTE — Progress Notes (Signed)
Patient ID: Maryclare BeanMelinda Hertzberg, female   DOB: 01/10/1986, 29 y.o.   MRN: [id number] Maryclare BeanMelinda Flakes is a 29 y.o. G2P0010 at 1959w1d.  IUPC placed to maternal right (away from face) w/out difficulty. Amnioinfusion started. Per consult w/ Dr. Shawnie PonsPratt will try putting pt in trendelenburg to hopefully allow baby to come out of the pelvis and then put pt in left exaggerated Sims to promote rotation of fetal spine to maternal right. Explained to pt and family.   [place] Rapheal Masso, [place] 04/07/2015 4:59 PM

## 2015-04-07 NOTE — Anesthesia Preprocedure Evaluation (Addendum)
Anesthesia Evaluation  Patient identified by MRN, date of birth, ID band Patient awake    Reviewed: Allergy & Precautions, H&P , Patient's Chart, lab work & pertinent test results  Airway Mallampati: III  TM Distance: >3 FB Neck ROM: full    Dental no notable dental hx. (+) Teeth Intact   Pulmonary neg pulmonary ROS,  breath sounds clear to auscultation  Pulmonary exam normal       Cardiovascular negative cardio ROS Normal cardiovascular examRhythm:regular Rate:Normal     Neuro/Psych negative neurological ROS  negative psych ROS   GI/Hepatic negative GI ROS, Neg liver ROS,   Endo/Other  Obesity  Renal/GU negative Renal ROS  negative genitourinary   Musculoskeletal   Abdominal   Peds  Hematology  (+) anemia ,   Anesthesia Other Findings   Reproductive/Obstetrics (+) Pregnancy                            Anesthesia Physical Anesthesia Plan  ASA: II  Anesthesia Plan: Epidural   Post-op Pain Management:    Induction:   Airway Management Planned: Natural Airway  Additional Equipment:   Intra-op Plan:   Post-operative Plan:   Informed Consent: I have reviewed the patients History and Physical, chart, labs and discussed the procedure including the risks, benefits and alternatives for the proposed anesthesia with the patient or authorized representative who has indicated his/her understanding and acceptance.     Plan Discussed with: Anesthesiologist, CRNA and Surgeon  Anesthesia Plan Comments: (Patient for urgent C/Section for non reassuring FHR tracing. Will used epidural for C/Section. )       Anesthesia Quick Evaluation

## 2015-04-07 NOTE — Progress Notes (Signed)
Patient ID: Stacey BeanMelinda Keith, female   DOB: 09/16/1986, 29 y.o.   MRN: [id number] Late entry Pt. Was 9.5 cm dilated with transverse facial/brow presentation.  I elevated the baby out of the pelvis and manually rotated the head to vertex.  After this maneuver, fetal bradycardia to the 80-90's occurred which was not amenable to position change, terbutaline, Oxygen and ultimately knee/chest position.  After FHR down x 9 minutes, decision made to proceed with emergent abdominal delivery.

## 2015-04-07 NOTE — Op Note (Signed)
Preoperative Diagnosis:  IUP @ 4145w1d, Brow presentation, fetal bradycardia  Postoperative Diagnosis:  Same  Procedure: Primary low transverse cesarean section  Surgeon: Tinnie Gensanya [name], M.D.  Assistant: None  Findings: Viable female infant, APGAR (1 MIN): 8   APGAR (5 MINS): 9   vertex presentation, ROT position  Estimated blood loss: 1000 cc  Complications: None known  Specimens: Placenta to labor and delivery  Reason for procedure: Briefly, the patient is a 29 y.o. [id number] 5645w1d who presents for IOL secondary to postdates.  She progressed quickly to 8 cm and was found to have a transverse brow presentation. Pt. Repositioned and repeat exam revealed continued brow in the left transverse position. Attempt to rotate head to vertex was successful but led to fetal bradycardia and required Cesarean delivery.  Procedure: Patient is a to the OR where spinal analgesia was administered. She was then placed in a supine position with left lateral tilt. She received 2 g of Ancef and SCDs were in place. A timeout was performed. She was prepped and draped in the usual sterile fashion. A Foley catheter was present in the bladder. A knife was then used to make a Pfannenstiel incision. This incision was carried out to underlying fascia which was divided in the midline with the knife. The incision was extended laterally, bluntly.  The rectus was divided in the midline.  The peritoneal cavity was entered bluntly.  A knife was used to make a low transverse incision on the uterus. This incision was carried down to the amniotic cavity was entered. Fetus was in ROT position and was brought up out of the incision without difficulty. Cord was clamped x 2 and cut. Infant taken to waiting pediatrician.  Cord blood was obtained. Placenta was delivered from the uterus.  Uterus was cleaned with dry lap pads. Uterine incision closed with 0 Vicryl suture in a running fashion. A second layer of 0 Vicryl in an imbricating fashion  was used to achieve hemostasis. There was red urine noted, but bladder inspected visually and appeared intact. Peritoneal closure was done with 0 Vicryl suture.  Fascia is closed with 0 Vicryl suture in a running fashion.Skin closed using 3-0 Vicryl on a Keith needle.  Steri strips applied, followed by pressure dressing.  All instrument, needle and lap counts were correct x 2.  Patient was awake and taken to PACU stable.  Infant to Newborn Nursery, stable.   [name],[name] 04/07/2015 7:39 PM

## 2015-04-07 NOTE — Transfer of Care (Signed)
Immediate Anesthesia Transfer of Care Note  Patient: Maryclare BeanMelinda Swoveland  Procedure(s) Performed: Procedure(s): CESAREAN SECTION (N/A)  Patient Location: PACU  Anesthesia Type:Epidural  Level of Consciousness: awake  Airway & Oxygen Therapy: Patient Spontanous Breathing  Post-op Assessment: Report given to RN  Post vital signs: Reviewed and stable  Last Vitals:  Filed Vitals:   04/07/15 1730  BP: 124/88  Pulse: 118  Temp:   Resp: 16    Complications: No apparent anesthesia complications

## 2015-04-07 NOTE — Progress Notes (Signed)
Patient ID: Stacey BeanMelinda Ratliff, female   DOB: 08/03/1986, 29 y.o.   MRN: [id number] Asked to see patient to eval for possible brow presentation. She has a brow/face to the maternal left. Will readjust maternal position and re-eval in a few hours. FHR with excellent variability, with mild variable decels noted, Category II.

## 2015-04-08 ENCOUNTER — Encounter (HOSPITAL_COMMUNITY): Payer: Self-pay | Admitting: Family Medicine

## 2015-04-08 LAB — CBC
HEMATOCRIT: 24.3 % — AB (ref 36.0–46.0)
Hemoglobin: 7.3 g/dL — ABNORMAL LOW (ref 12.0–15.0)
MCH: 23.9 pg — ABNORMAL LOW (ref 26.0–34.0)
MCHC: 30.5 g/dL (ref 30.0–36.0)
MCV: 78.4 fL (ref 78.0–100.0)
PLATELETS: 262 10*3/uL (ref 150–400)
RBC: 3.1 MIL/uL — ABNORMAL LOW (ref 3.87–5.11)
RDW: 16.6 % — AB (ref 11.5–15.5)
WBC: 14.8 10*3/uL — ABNORMAL HIGH (ref 4.0–10.5)

## 2015-04-08 NOTE — Progress Notes (Signed)
UR chart review completed.  

## 2015-04-08 NOTE — Lactation Note (Signed)
This note was copied from the chart of Boy Maryclare BeanMelinda Coykendall. Lactation Consultation Note  Patient Name: Boy Maryclare BeanMelinda Trudo ZOXWR'UToday's Date: 04/08/2015 Reason for consult: Initial assessment Assisted Mom with positioning and obtaining depth with latch. Mom has very large nipples, the initial latch was shallow but with nursing the baby obtained more depth. Demonstrated breast compression to help with latch. Basic teaching reviewed with parents. Advised baby should be at the breast 8-12 times in 24 hours and with feeding ques. Cluster feeding discussed. Lactation brochure left for review, advised of OP services and support group. Encouraged to call for assist as needed.   Maternal Data Has patient been taught Hand Expression?: Yes Does the patient have breastfeeding experience prior to this delivery?: No  Feeding Feeding Type: Breast Fed Length of feed: 25 min  LATCH Score/Interventions Latch: Grasps breast easily, tongue down, lips flanged, rhythmical sucking. Intervention(s): Adjust position;Assist with latch;Breast massage;Breast compression  Audible Swallowing: A few with stimulation  Type of Nipple: Everted at rest and after stimulation  Comfort (Breast/Nipple): Soft / non-tender     Hold (Positioning): Assistance needed to correctly position infant at breast and maintain latch. Intervention(s): Breastfeeding basics reviewed;Support Pillows;Position options;Skin to skin  LATCH Score: 8  Lactation Tools Discussed/Used WIC Program: No   Consult Status Consult Status: Follow-up Date: 04/08/15 Follow-up type: In-patient    Alfred LevinsGranger, Keivon Garden Ann 04/08/2015, 6:17 PM

## 2015-04-08 NOTE — Addendum Note (Signed)
Addendum  created 04/08/15 [id number] by Earmon PhoenixValerie P Janique Hoefer, CRNA   Modules edited: Notes Section   Notes Section:  File: [id number]

## 2015-04-08 NOTE — Anesthesia Postprocedure Evaluation (Signed)
  Anesthesia Post-op Note  Patient: Stacey BeanMelinda Rundquist  Procedure(s) Performed: Procedure(s): CESAREAN SECTION (N/A)  Patient Location: PACU  Anesthesia Type:Epidural  Level of Consciousness: awake  Airway and Oxygen Therapy: Patient Spontanous Breathing and Patient connected to nasal cannula oxygen  Post-op Pain: none  Post-op Assessment: Post-op Vital signs reviewed, Patient's Cardiovascular Status Stable, Respiratory Function Stable, Patent Airway, No signs of Nausea or vomiting and Pain level controlled LLE Motor Response: Purposeful movement LLE Sensation: Tingling RLE Motor Response: Purposeful movement RLE Sensation: Tingling L Sensory Level: S1-Sole of foot, small toes R Sensory Level: S1-Sole of foot, small toes  Post-op Vital Signs: Reviewed and stable  Last Vitals:  Filed Vitals:   04/08/15 0600  BP:   Pulse:   Temp:   Resp: 18    Complications: No apparent anesthesia complications

## 2015-04-08 NOTE — Anesthesia Postprocedure Evaluation (Signed)
  Anesthesia Post-op Note  Patient: Stacey BeanMelinda Keith  Procedure(s) Performed: Procedure(s): CESAREAN SECTION (N/A)  Patient Location: Mother/Baby  Anesthesia Type:Epidural  Level of Consciousness: awake, alert , oriented and patient cooperative  Airway and Oxygen Therapy: Patient Spontanous Breathing  Post-op Pain: mild  Post-op Assessment: Patient's Cardiovascular Status Stable, Respiratory Function Stable, No headache, No backache and Patient able to bend at knees;stands without problem  Post-op Vital Signs: Reviewed and stable  Last Vitals:  Filed Vitals:   04/08/15 0600  BP:   Pulse:   Temp:   Resp: 18    Complications: No apparent anesthesia complications

## 2015-04-08 NOTE — Progress Notes (Signed)
Post Operation Day 1  Subjective:  Stacey Keith is a 29 y.o. G2P1011 3318w1d s/p [facility]LTCS [facility]  No acute events overnight.  Pt states that she is a little weak when ambulating, she has a catheter in, and has only drank water but would like to eat soon.  She denies nausea or vomiting.  Pain is well controlled.  She has not had flatus. She has not had bowel movement.  Lochia Moderate.  Plan for birth control is condoms.  Method of Feeding: Breast. Baby has latched on several times  Objective: BP 140/67 mmHg  Pulse 126  Temp(Src) 99 F (37.2 C) (Oral)  Resp 18  Ht 5\' 2"  (1.575 m)  Wt 205 lb (92.987 kg)  BMI 37.49 kg/m2  SpO2 95%  LMP 06/23/2014 (Exact Date)  Breastfeeding? Unknown  Physical Exam:  General: alert, cooperative and no distress Lochia:normal flow Chest: CTAB Heart: RRR no m/r/g Abdomen: +BS, soft, nontender Incision: clean without bleeding or leaking. Bandage is dry. Uterine Fundus: firm below umbilicus  DVT Evaluation: No evidence of DVT seen on physical exam. Extremities: mild 1+ edema in feet   Recent Labs  04/07/15 0815 04/08/15 0536  HGB 9.5* 7.3*  HCT 31.4* 24.3*    Assessment/Plan:  ASSESSMENT: Stacey BeanMelinda Greenly is a 29 y.o. G2P1011 6018w1d pod #1 s/p pLTCS doing well.   Breastfeeding and Contraception method condoms. Continue to moniter incision.   LOS: 1 day   I have seen and examined this patient and agree the above assessment. CRESENZO-DISHMAN,Averyana Pillars 04/12/2015 1:33 PM

## 2015-04-09 LAB — BIRTH TISSUE RECOVERY COLLECTION (PLACENTA DONATION)

## 2015-04-09 MED ORDER — FERROUS SULFATE 325 (65 FE) MG PO TABS: 325.0000 mg | ORAL_TABLET | Freq: Every day | ORAL | Status: DC

## 2015-04-09 NOTE — Discharge Summary (Signed)
Obstetric Discharge Summary Reason for Admission: induction of labor Prenatal Procedures: none Intrapartum Procedures: cesarean: low cervical, transverse Postpartum Procedures: none Complications-Operative and Postpartum: none   Stacey Bores, MD Physician Signed Obstetrics/Gynecology Op Note 04/07/2015 7:39 PM    Expand All Collapse All   Preoperative Diagnosis: IUP @ [redacted]w[redacted]d, Brow presentation, fetal bradycardia  Postoperative Diagnosis: Same  Procedure: Primary low transverse cesarean section  Surgeon: Stacey Keith, M.D.  Assistant: None  Findings: Viable female infant, APGAR (1 MIN): 8  APGAR (5 MINS): 9  vertex presentation, ROT position  Estimated blood loss: 1000 cc  Complications: None known  Specimens: Placenta to labor and delivery  Reason for procedure: Briefly, the patient is a 29 y.o. [id number] [redacted]w[redacted]d who presents for IOL secondary to postdates. She progressed quickly to 8 cm and was found to have a transverse brow presentation. Pt. Repositioned and repeat exam revealed continued brow in the left transverse position. Attempt to rotate head to vertex was successful but led to fetal bradycardia and required Cesarean delivery.  Procedure: Patient is a to the OR where spinal analgesia was administered. She was then placed in a supine position with left lateral tilt. She received 2 g of Ancef and SCDs were in place. A timeout was performed. She was prepped and draped in the usual sterile fashion. A Foley catheter was present in the bladder. A knife was then used to make a Pfannenstiel incision. This incision was carried out to underlying fascia which was divided in the midline with the knife. The incision was extended laterally, bluntly. The rectus was divided in the midline. The peritoneal cavity was entered bluntly. A knife was used to make a low transverse incision on the uterus. This incision was carried down to the amniotic cavity was entered. Fetus was in ROT position  and was brought up out of the incision without difficulty. Cord was clamped x 2 and cut. Infant taken to waiting pediatrician. Cord blood was obtained. Placenta was delivered from the uterus. Uterus was cleaned with dry lap pads. Uterine incision closed with 0 Vicryl suture in a running fashion. A second layer of 0 Vicryl in an imbricating fashion was used to achieve hemostasis. There was red urine noted, but bladder inspected visually and appeared intact. Peritoneal closure was done with 0 Vicryl suture. Fascia is closed with 0 Vicryl suture in a running fashion.Skin closed using 3-0 Vicryl on a Keith needle. Steri strips applied, followed by pressure dressing. All instrument, needle and lap counts were correct x 2. Patient was awake and taken to PACU stable. Infant to Newborn Nursery, stable.   Stacey Keith 04/07/2015 7:39 PM        Routing History     Date/Time From To Method   04/07/2015 7:43 PM Stacey Bores, MD No Pcp Per Patient In Basket      HEMOGLOBIN  Date Value Ref Range Status  04/08/2015 7.3* 12.0 - 15.0 g/dL Final    Comment:    DELTA CHECK NOTED REPEATED TO VERIFY   09/02/2014 11.8 g/dL Final   HCT  Date Value Ref Range Status  04/08/2015 24.3* 36.0 - 46.0 % Final  09/02/2014 34 % Final    Physical Exam:  General: alert, cooperative and appears stated age Lochia: appropriate Uterine Fundus: firm Incision: no significant drainage DVT Evaluation: No evidence of DVT seen on physical exam. Negative Homan's sign. No cords or calf tenderness.  Discharge Diagnoses: Term Pregnancy-delivered  Anemia Iron continue Prenatal vitamins OTC Motrin for pain  control  Discharge Information: Date: 04/09/2015 Activity: pelvic rest Diet: routine Medications: None Condition: stable Instructions: Follow up in office in 1-2 weeks Discharge to: home   Newborn Data: Live born female  Birth Weight: 7 lb 10.8 oz (3480 g) APGAR: 8, 9  Home with mother pending  circumcision.  Stacey Keith 04/09/2015, 12:16 PM

## 2015-04-09 NOTE — Discharge Instructions (Signed)
Postpartum Depression and Baby Blues °The postpartum period begins right after the birth of a baby. During this time, there is often a great amount of joy and excitement. It is also a time of many changes in the life of the parents. Regardless of how many times a mother gives birth, each child brings new challenges and dynamics to the family. It is not unusual to have feelings of excitement along with confusing shifts in moods, emotions, and thoughts. All mothers are at risk of developing postpartum depression or the "baby blues." These mood changes can occur right after giving birth, or they may occur many months after giving birth. The baby blues or postpartum depression can be mild or severe. Additionally, postpartum depression can go away rather quickly, or it can be a long-term condition.  °CAUSES °Raised hormone levels and the rapid drop in those levels are thought to be a main cause of postpartum depression and the baby blues. A number of hormones change during and after pregnancy. Estrogen and progesterone usually decrease right after the delivery of your baby. The levels of thyroid hormone and various cortisol steroids also rapidly drop. Other factors that play a role in these mood changes include major life events and genetics.  °RISK FACTORS °If you have any of the following risks for the baby blues or postpartum depression, know what symptoms to watch out for during the postpartum period. Risk factors that may increase the likelihood of getting the baby blues or postpartum depression include: °· Having a personal or family history of depression.   °· Having depression while being pregnant.   °· Having premenstrual mood issues or mood issues related to oral contraceptives. °· Having a lot of life stress.   °· Having marital conflict.   °· Lacking a social support network.   °· Having a baby with special needs.   °· Having health problems, such as diabetes.   °SIGNS AND SYMPTOMS °Symptoms of baby blues  include: °· Brief changes in mood, such as going from extreme happiness to sadness. °· Decreased concentration.   °· Difficulty sleeping.   °· Crying spells, tearfulness.   °· Irritability.   °· Anxiety.   °Symptoms of postpartum depression typically begin within the first month after giving birth. These symptoms include: °· Difficulty sleeping or excessive sleepiness.   °· Marked weight loss.   °· Agitation.   °· Feelings of worthlessness.   °· Lack of interest in activity or food.   °Postpartum psychosis is a very serious condition and can be dangerous. Fortunately, it is rare. Displaying any of the following symptoms is cause for immediate medical attention. Symptoms of postpartum psychosis include:  °· Hallucinations and delusions.   °· Bizarre or disorganized behavior.   °· Confusion or disorientation.   °DIAGNOSIS  °A diagnosis is made by an evaluation of your symptoms. There are no medical or lab tests that lead to a diagnosis, but there are various questionnaires that a health care provider may use to identify those with the baby blues, postpartum depression, or psychosis. Often, a screening tool called the Edinburgh Postnatal Depression Scale is used to diagnose depression in the postpartum period.  °TREATMENT °The baby blues usually goes away on its own in 1-2 weeks. Social support is often all that is needed. You will be encouraged to get adequate sleep and rest. Occasionally, you may be given medicines to help you sleep.  °Postpartum depression requires treatment because it can last several months or longer if it is not treated. Treatment may include individual or group therapy, medicine, or both to address any social, physiological, and psychological   factors that may play a role in the depression. Regular exercise, a healthy diet, rest, and social support may also be strongly recommended.  °Postpartum psychosis is more serious and needs treatment right away. Hospitalization is often needed. °HOME CARE  INSTRUCTIONS °· Get as much rest as you can. Nap when the baby sleeps.   °· Exercise regularly. Some women find yoga and walking to be beneficial.   °· Eat a balanced and nourishing diet.   °· Do little things that you enjoy. Have a cup of tea, take a bubble bath, read your favorite magazine, or listen to your favorite music. °· Avoid alcohol.   °· Ask for help with household chores, cooking, grocery shopping, or running errands as needed. Do not try to do everything.   °· Talk to people close to you about how you are feeling. Get support from your partner, family members, friends, or other new moms. °· Try to stay positive in how you think. Think about the things you are grateful for.   °· Do not spend a lot of time alone.   °· Only take over-the-counter or prescription medicine as directed by your health care provider. °· Keep all your postpartum appointments.   °· Let your health care provider know if you have any concerns.   °SEEK MEDICAL CARE IF: °You are having a reaction to or problems with your medicine. °SEEK IMMEDIATE MEDICAL CARE IF: °· You have suicidal feelings.   °· You think you may harm the baby or someone else. °MAKE SURE YOU: °· Understand these instructions. °· Will watch your condition. °· Will get help right away if you are not doing well or get worse. °Document Released: 06/21/2004 Document Revised: 09/22/2013 Document Reviewed: 06/29/2013 °ExitCare® Patient Information ©2015 [employer]. This information is not intended to replace advice given to you by your health care provider. Make sure you discuss any questions you have with your health care provider. °Postpartum Care After Cesarean Delivery °After you deliver your newborn (postpartum period), the usual stay in the hospital is 24-72 hours. If there were problems with your labor or delivery, or if you have other medical problems, you might be in the hospital longer.  °While you are in the hospital, you will receive help and instructions  on how to care for yourself and your newborn during the postpartum period.  °While you are in the hospital: °· It is normal for you to have pain or discomfort from the incision in your abdomen. Be sure to tell your nurses when you are having pain, where the pain is located, and what makes the pain worse. °· If you are breastfeeding, you may feel uncomfortable contractions of your uterus for a couple of weeks. This is normal. The contractions help your uterus get back to normal size. °· It is normal to have some bleeding after delivery. °· For the first 1-3 days after delivery, the flow is red and the amount may be similar to a period. °· It is common for the flow to start and stop. °· In the first few days, you may pass some small clots. Let your nurses know if you begin to pass large clots or your flow increases. °· Do not  flush blood clots down the toilet before having the nurse look at them. °· During the next 3-10 days after delivery, your flow should become more watery and pink or brown-tinged in color. °· Ten to fourteen days after delivery, your flow should be a small amount of yellowish-white discharge. °· The amount of your flow   will decrease over the first few weeks after delivery. Your flow may stop in 6-8 weeks. Most women have had their flow stop by 12 weeks after delivery. °· You should change your sanitary pads frequently. °· Wash your hands thoroughly with soap and water for at least 20 seconds after changing pads, using the toilet, or before holding or feeding your newborn. °· Your intravenous (IV) tubing will be removed when you are drinking enough fluids. °· The urine drainage tube (urinary catheter) that was inserted before delivery may be removed within 6-8 hours after delivery or when feeling returns to your legs. You should feel like you need to empty your bladder within the first 6-8 hours after the catheter has been removed. °· In case you become weak, lightheaded, or faint, call your nurse  before you get out of bed for the first time and before you take a shower for the first time. °· Within the first few days after delivery, your breasts may begin to feel tender and full. This is called engorgement. Breast tenderness usually goes away within 48-72 hours after engorgement occurs. You may also notice milk leaking from your breasts. If you are not breastfeeding, do not stimulate your breasts. Breast stimulation can make your breasts produce more milk. °· Spending as much time as possible with your newborn is very important. During this time, you and your newborn can feel close and get to know each other. Having your newborn stay in your room (rooming in) will help to strengthen the bond with your newborn. It will give you time to get to know your newborn and become comfortable caring for your newborn. °· Your hormones change after delivery. Sometimes the hormone changes can temporarily cause you to feel sad or tearful. These feelings should not last more than a few days. If these feelings last longer than that, you should talk to your caregiver. °· If desired, talk to your caregiver about methods of family planning or contraception. °· Talk to your caregiver about immunizations. Your caregiver may want you to have the following immunizations before leaving the hospital: °· Tetanus, diphtheria, and pertussis (Tdap) or tetanus and diphtheria (Td) immunization. It is very important that you and your family (including grandparents) or others caring for your newborn are up-to-date with the Tdap or Td immunizations. The Tdap or Td immunization can help protect your newborn from getting ill. °· Rubella immunization. °· Varicella (chickenpox) immunization. °· Influenza immunization. You should receive this annual immunization if you did not receive the immunization during your pregnancy. °Document Released: 06/11/2012 Document Reviewed: 06/11/2012 °ExitCare® Patient Information ©2015 [employer]. This  information is not intended to replace advice given to you by your health care provider. Make sure you discuss any questions you have with your health care provider. ° °

## 2015-04-12 ENCOUNTER — Telehealth: Payer: Self-pay | Admitting: *Deleted

## 2015-04-12 NOTE — Telephone Encounter (Signed)
Pt left message stating that she has a concern about post C/section. Please call back.

## 2015-04-13 NOTE — Telephone Encounter (Signed)
Called patient stating I am returning her phone call. Patient states during her urgent c-section her blood pressure dropped too low like 70s/40s states she had a hard time breathing and felt like she couldn't catch her breath. Patient states yesterday she went to lay down and after a few minutes of lying down she felt like her blood pressure dropped and had a difficult time catching her breath like during surgery. States she checked her blood pressure and it was 120s/80s but her husband told her she lost color in her face and lips. Patient states she has otherwise felt fine. Per chart review patient's last hgb was 7.3. Patient reports taking iron daily. Discussed with patient it could be related to her very low iron levels. Encouraged extra iron intake in food and increasing water intake. Also discussed increasing calories and water intake if she is breastfeeding as well. Discussed that if she feels again she cannot breath or catch her breath to go to [facility] for further evaluation otherwise we will see her on 7/20 for incision check. Patient verbalized understanding to all and asked about lactation's phone number. Provided number to patient. Patient verbalized understanding and had no other questions

## 2015-04-14 ENCOUNTER — Ambulatory Visit (HOSPITAL_COMMUNITY)
Admission: RE | Admit: 2015-04-14 | Discharge: 2015-04-14 | Disposition: A | Discharge status: Home / Self Care | Payer: Medicaid Other | Source: Ambulatory Visit | Attending: Family Medicine | Admitting: Family Medicine

## 2015-04-14 NOTE — Lactation Note (Signed)
Lactation Consult  Mother's reason for visit: per mom - for peace mind that things are going well as they should  Visit Type:  Feeding assessment  Appointment Notes:  Shooting pain deep in both breast and aching under arm pits  Wants assistance with latch - left apt reminder  Consult:  Initial Lactation Consultant:  Stacey Keith  ________________________________________________________________________ Stacey Keith Name: Stacey Keith Date of Birth: 04/07/2013 Pediatrician: Dr. Durene Keith  Gender: female Gestational Age: [redacted]w[redacted]d (At Birth) Birth Weight: 7 lb 10.8 oz (3480 g) Weight at Discharge: Weight: 7 lb 6.3 oz (3355 g)Date of Discharge: 04/09/2015 [facility] Weights   04/07/15 1848 04/09/15 0025  Weight: 7 lb 10.8 oz (3480 g) 7 lb 6.3 oz (3355 g)   [facility] weight taken from [facility]: 7-1 oz  Location:Pediatrician's office Weight today: 3352 g , 7-6.2 oz   _____________________________________________________________________  Mother's Name: Stacey Keith Type of delivery:  C/section , Baby is 61 days old  Breastfeeding Experience:  1st baby , only for 1 week  Maternal Medical Conditions:  No risk  Maternal Medications:  PNV , pain med post c/section   ________________________________________________________________________  Breastfeeding History (Post Discharge)  Frequency of breastfeeding:  Per mom , as needed - usually every 1-3 hours  Duration of feeding:  3- mins -2 hours   Supplementing: per mom none   Pumping: none as of yet ( has a manual pump from the  Hospital ) and a single electric )   Infant Intake and Output Assessment  Voids: 5-7  in 24 hrs.  Color:  Clear yellow Stools:  0-1  in 24 hrs.  Color:  Green Last night large amount )   ________________________________________________________________________  Maternal Breast Assessment  Breast:  Full Nipple:  Erect Pain level:  0 Pain interventions:   Expressed breast milk  _______________________________________________________________________ Feeding Assessment/Evaluation  Initial feeding assessment:  Infant's oral assessment:  Variance ( slight posterior short frenulum )   Positioning:  Cross cradle Left breast  LATCH documentation:  Latch:  1 = Repeated attempts needed to sustain latch, nipple held in mouth throughout feeding, stimulation needed to elicit sucking reflex.  Audible swallowing:  2 = Spontaneous and intermittent  Type of nipple:  2 = Everted at rest and after stimulation  Comfort (Breast/Nipple):  1 = Filling, red/small blisters or bruises, mild/mod discomfort  Hold (Positioning):  1 = Assistance needed to correctly position infant at breast and maintain latch  LATCH score:  7  Attached assessment:  Shallow at 1st , and adjusted with re-latch and breast compressions , and hand expressing off the fullness ( 10 ml )   Lips flanged:  Yes.    Lips untucked:  Yes.  ( LC gently eased chin down to obtain better depth   Suck assessment:  Nutritive  Tools:  None  Instructed on use and cleaning of tool:  No.  Pre-feed weight:  3352g  (7-6.2 oz .) Post-feed weight:  3382 g (7-7.3 oz ) Amount transferred:  30 ml  Amount supplemented: none   Additional Feeding Assessment -   Infant's oral assessment:  Variance 9 see above note )   Positioning:  Football  Right breast   LATCH documentation: ( had to hand express off the fullness ) quick let down and when baby feeding LC  noted the let down is so quick and mom so full milk coming out of sides of Baby's mouth.  Latch:  2 = Grasps breast easily, tongue  down, lips flanged, rhythmical sucking.  Audible swallowing:  2 = Spontaneous and intermittent  Type of nipple:  2 = Everted at rest and after stimulation  Comfort (Breast/Nipple):  1 = Filling, red/small blisters or bruises, mild/mod discomfort  Hold (Positioning):  1 = Assistance needed to correctly position infant  at breast and maintain latch  LATCH score:  8   Attached assessment:  Shallow at 1st ,LC showed mom and dad why it's important to obtain depth   Lips flanged:  Yes.    Lips untucked:  Yes.    Suck assessment:  Nutritive  Tools:  None  Instructed on use and cleaning of tool:  No.  Pre-feed weight:  3382g  (7-7.3 oz.) Post-feed weight:  3390g (7-7.6 oz ) Amount transferred:  8 ml  Amount supplemented:  None   Relatched the left breast a 3rd time due to over fullness Pre- weight 3390g , 7-7.6 oz  Post - weight - 3400g  Amount transferred :  10 ml    Total amount pumped post feed: 10 ml ( hand expressed ( and mom leaked large amount with feeding )   Total amount transferred:  48 ml ( adequate for 417 days old )  Total supplement given: none   Lactation Impression: Mom milks is in , by the sounds has had some challenges with engorgement at home and has worked through it with feeding her baby often. Baby weight is increasing BW - 7-10.8 oz , D/C weight 7-6 oz , today's weight 7-6.2 oz , baby has gained 5 oz since Pedis visit on Monday July 11 th.  Mom positions baby properly at the breast , does has challenge obtaining a deep latch , and LC showed dad how he can assist to obtain depth . And with instruction  And minimal assist by dad was able to do so. LC stressed the importance of depth the breast .  LC noted a slight short posterior frenulum , which doesn't seem to interfere with latch . Mom has a quick let down , and LC assisted her to hand express 10 ml off to baby was not being flooded with the latch.  LC stressed to mom and dad baby needs to get use to the volume of milk mom has.  Both mom and dad receptive to review and appreciative of assistance   Lactation Plan of Care:  Per mom F/U with Stacey Keith July 21th  Steps for latching - prior to latch  Breast massage , hand express off fullness ( check breast ) - pre-pump if needed ( hand pump )  Latch skin to skin  To obtain  depth , tickle "[name]'s upper lip , wait for a wide open mouth , latch with breast compressions until swallows and latch comfortable  Use firm support  Tips for BF  If [name] only feeds 1st breast / release the 2nd breast only to comfort with hand pump or single electric  Rotate BF positions at least between 2  Engorgement prevention and tx - pages 24 Baby and me booklet  Full = Good sign - fatty milk is transition in , If breast are feeling snug , tight - heading to firm engorged - need to ice 15 -10 mins and then feed, hand express, or pump.   [facility] questions or concerns , consider attending the [facility] on Monday's at 7pm or Tuesday 11 am [facility].

## 2015-04-20 ENCOUNTER — Ambulatory Visit: Payer: Medicaid Other

## 2015-04-20 VITALS — BP 123/85 | HR 85 | Temp 98.6°F

## 2015-04-20 DIAGNOSIS — Z5189 Encounter for other specified aftercare: Secondary | ICD-10-CM

## 2015-04-20 NOTE — Progress Notes (Signed)
Pt here for wound check post emergent c-section on 04/09/15.  Incision healing well.  No drainage, no odor.  Two steri-strips remain. Cleansed incision with NS, and dried with gauze.  Pt tolerated well.  Advised pt on continuing to monitor for signs of infection and to report to office with concerns/questions.  Pt has pp visit scheduled for 05/11/15; pt aware of appt.  Pt stated understanding.

## 2015-04-25 ENCOUNTER — Inpatient Hospital Stay (HOSPITAL_COMMUNITY)
Admission: AD | Admit: 2015-04-25 | Discharge: 2015-04-25 | Disposition: A | Discharge status: Home / Self Care | Payer: Medicaid Other | Source: Ambulatory Visit | Attending: Obstetrics & Gynecology | Admitting: Obstetrics & Gynecology

## 2015-04-25 ENCOUNTER — Encounter (HOSPITAL_COMMUNITY): Payer: Self-pay | Admitting: *Deleted

## 2015-04-25 ENCOUNTER — Telehealth: Payer: Self-pay | Admitting: Gynecology

## 2015-04-25 DIAGNOSIS — O9122 Nonpurulent mastitis associated with the puerperium: Secondary | ICD-10-CM | POA: Insufficient documentation

## 2015-04-25 DIAGNOSIS — O864 Pyrexia of unknown origin following delivery: Secondary | ICD-10-CM | POA: Diagnosis present

## 2015-04-25 DIAGNOSIS — N61 Inflammatory disorders of breast: Secondary | ICD-10-CM

## 2015-04-25 LAB — URINE MICROSCOPIC-ADD ON

## 2015-04-25 LAB — URINALYSIS, ROUTINE W REFLEX MICROSCOPIC
BILIRUBIN URINE: NEGATIVE
GLUCOSE, UA: NEGATIVE mg/dL
Ketones, ur: NEGATIVE mg/dL
NITRITE: NEGATIVE
Protein, ur: NEGATIVE mg/dL
Specific Gravity, Urine: <1.005 — ABNORMAL LOW (ref 1.005–1.030)
UROBILINOGEN UA: 0.2 mg/dL (ref 0.0–1.0)
pH: 5 (ref 5.0–8.0)

## 2015-04-25 MED ORDER — CEPHALEXIN 500 MG PO CAPS
500.0000 mg | ORAL_CAPSULE | Freq: Once | ORAL | Status: AC
  Administered 2015-04-25: 500 mg via ORAL
  Filled 2015-04-25: qty 1

## 2015-04-25 MED ORDER — CEPHALEXIN 500 MG PO CAPS: 500.0000 mg | ORAL_CAPSULE | Freq: Once | ORAL | Status: DC

## 2015-04-25 NOTE — MAU Provider Note (Signed)
  History   G1 at 2 and a half wks S/P LTCS in with c/o flu like symptoms and fever that started today. Pt is breast feeding.also c/o left sided breast pain. Took dose of motrin at 1500.  CSN: [id number]  Arrival date and time: 04/25/15 7829   First Provider Initiated Contact with Patient 04/25/15 2033      No chief complaint on file.  HPI  OB History    Gravida Para Term Preterm AB TAB SAB Ectopic Multiple Living   0 1 0 0 1      Past Medical History  Diagnosis Date  . Medical history non-contributory     Past Surgical History  Procedure Laterality Date  . Uterine septum resection  04/27/2014  . Cesarean section N/A 04/07/2015    Procedure: CESAREAN SECTION;  Surgeon: Reva Bores, MD;  Location: [facility];  Service: Obstetrics;  Laterality: N/A;    Family History  Problem Relation Age of Onset  . Breast cancer Paternal Grandmother     History  Substance Use Topics  . Smoking status: Never Smoker   . Smokeless tobacco: Never Used  . Alcohol Use: No    Allergies: No Known Allergies  Prescriptions prior to admission  Medication Sig Dispense Refill Last Dose  . ferrous sulfate 325 (65 FE) MG tablet Take 1 tablet (325 mg total) by mouth daily. 30 tablet 3   . Prenatal Vit-Fe Fumarate-FA (PRENATAL MULTIVITAMIN) TABS tablet Take 1 tablet by mouth daily at 12 noon.   04/06/2015 at Unknown time    Review of Systems  Constitutional: Positive for fever, chills and malaise/fatigue.  HENT: Negative.   Eyes: Negative.   Respiratory: Negative.   Cardiovascular: Negative.   Gastrointestinal: Negative.   Genitourinary: Negative.   Musculoskeletal: Negative.   Skin: Negative.   Neurological: Negative.   Endo/Heme/Allergies: Negative.   Psychiatric/Behavioral: Negative.    Physical Exam   Blood pressure 126/74, pulse 129, temperature 99.6 F (37.6 C), temperature source Oral, resp. rate 18, height  (1.549 m), weight 180 lb 4 oz (81.761 kg), SpO2 98 %, unknown  if currently breastfeeding.  Physical Exam  Constitutional: She is oriented to person, place, and time. She appears well-developed and well-nourished.  HENT:  Head: Normocephalic.  Eyes: Pupils are equal, round, and reactive to light.  Neck: Normal range of motion.  Cardiovascular: Normal rate, regular rhythm, normal heart sounds and intact distal pulses.   Respiratory: Effort normal and breath sounds normal.  GI: Soft. Bowel sounds are normal.  Genitourinary: Vagina normal and uterus normal.  Neurological: She is alert and oriented to person, place, and time.  Skin: Skin is warm and dry. There is erythema.  Psychiatric: She has a normal mood and affect. Her behavior is normal. Judgment and thought content normal.    MAU Course  Procedures  MDM mastitis  Assessment and Plan  Well healed low transverse incision, abd soft and non tender 4cm red,warm area, tender to touch left breast, Mastitis left breast Keflex 500 qid x 10 days D/c home  LAWSON, MARIE DARLENE 04/25/2015, 8:40 PM

## 2015-04-25 NOTE — Discharge Instructions (Signed)
Breastfeeding and Mastitis °Mastitis is inflammation of the breast tissue. It can occur in women who are breastfeeding. This can make breastfeeding painful. Mastitis will sometimes go away on its own. Your health care provider will help determine if treatment is needed. °CAUSES °Mastitis is often associated with a blocked milk (lactiferous) duct. This can happen when too much milk builds up in the breast. Causes of excess milk in the breast can include: °· Poor latch-on. If your baby is not latched onto the breast properly, she or he may not empty your breast completely while breastfeeding. °· Allowing too much time to pass between feedings. °· Wearing a bra or other clothing that is too tight. This puts extra pressure on the lactiferous ducts so milk does not flow through them as it should. °Mastitis can also be caused by a bacterial infection. Bacteria may enter the breast tissue through cuts or openings in the skin. In women who are breastfeeding, this may occur because of cracked or irritated skin. Cracks in the skin are often caused when your baby does not latch on properly to the breast. °SIGNS AND SYMPTOMS °· Swelling, redness, tenderness, and pain in an area of the breast. °· Swelling of the glands under the arm on the same side. °· Fever may or may not accompany mastitis. °If an infection is allowed to progress, a collection of pus (abscess) may develop. °DIAGNOSIS  °Your health care provider can usually diagnose mastitis based on your symptoms and a physical exam. Tests may be done to help confirm the diagnosis. These may include: °· Removal of pus from the breast by applying pressure to the area. This pus can be examined in the lab to determine which bacteria are present. If an abscess has developed, the fluid in the abscess can be removed with a needle. This can also be used to confirm the diagnosis and determine the bacteria present. In most cases, pus will not be present. °· Blood tests to determine if  your body is fighting a bacterial infection. °· Mammogram or ultrasound tests to rule out other problems or diseases. °TREATMENT  °Mastitis that occurs with breastfeeding will sometimes go away on its own. Your health care provider may choose to wait 24 hours after first seeing you to decide whether a prescription medicine is needed. If your symptoms are worse after 24 hours, your health care provider will likely prescribe an antibiotic medicine to treat the mastitis. He or she will determine which bacteria are most likely causing the infection and will then select an appropriate antibiotic medicine. This is sometimes changed based on the results of tests performed to identify the bacteria, or if there is no response to the antibiotic medicine selected. Antibiotic medicines are usually given by mouth. You may also be given medicine for pain. °HOME CARE INSTRUCTIONS °· Only take over-the-counter or prescription medicines for pain, fever, or discomfort as directed by your health care provider. °· If your health care provider prescribed an antibiotic medicine, take the medicine as directed. Make sure you finish it even if you start to feel better. °· Do not wear a tight or underwire bra. Wear a soft, supportive bra. °· Increase your fluid intake, especially if you have a fever. °· Continue to empty the breast. Your health care provider can tell you whether this milk is safe for your infant or needs to be thrown out. You may be told to stop nursing until your health care provider thinks it is safe for your baby.   Use a breast pump if you are advised to stop nursing. °· Keep your nipples clean and dry. °· Empty the first breast completely before going to the other breast. If your baby is not emptying your breasts completely for some reason, use a breast pump to empty your breasts. °· If you go back to work, pump your breasts while at work to stay in time with your nursing schedule. °· Avoid allowing your breasts to become  overly filled with milk (engorged). °SEEK MEDICAL CARE IF: °· You have pus-like discharge from the breast. °· Your symptoms do not improve with the treatment prescribed by your health care provider within 2 days. °SEEK IMMEDIATE MEDICAL CARE IF: °· Your pain and swelling are getting worse. °· You have pain that is not controlled with medicine. °· You have a red line extending from the breast toward your armpit. °· You have a fever or persistent symptoms for more than 2-3 days. °· You have a fever and your symptoms suddenly get worse. °MAKE SURE YOU:  °· Understand these instructions. °· Will watch your condition. °· Will get help right away if you are not doing well or get worse. °Document Released: 01/12/2005 Document Revised: 09/22/2013 Document Reviewed: 04/23/2013 °[employer]® Patient Information ©2015 [employer]. This information is not intended to replace advice given to you by your health care provider. Make sure you discuss any questions you have with your health care provider. ° °

## 2015-04-25 NOTE — MAU Note (Signed)
Post c-section on 04/07/2015. Emergent for face presentation and fetal distress. Fever since 2pm today-103.6 at home. Has noticed some drainage from incision that has a slight odor. Denies pain with the incision. Took motrin at 3pm and brought fever down to 102.6. States Denies urinary s/s.

## 2015-04-25 NOTE — Telephone Encounter (Signed)
On Call Note 5:29 PM: Returned page from patient away from a computer, C/O C-section 2 weeks ago and now with fevers to 103 with increasingly sore breasts.  Reviewed with patient that they paged the wrong physician and that she needs to be follow up with the physician/group who cared for her during her pregnancy and performed her C-section.  Patient agrees to call and have the correct physician paged.  I asked her to call me back if she has difficulty arranging this.

## 2015-05-04 ENCOUNTER — Encounter: Payer: Self-pay | Admitting: *Deleted

## 2015-05-04 NOTE — Progress Notes (Signed)
FMLA completed and faxed.

## 2015-05-11 ENCOUNTER — Ambulatory Visit (INDEPENDENT_AMBULATORY_CARE_PROVIDER_SITE_OTHER): Payer: Medicaid Other | Admitting: Obstetrics & Gynecology

## 2015-05-11 ENCOUNTER — Encounter: Payer: Self-pay | Admitting: Obstetrics & Gynecology

## 2015-05-11 DIAGNOSIS — B373 Candidiasis of vulva and vagina: Secondary | ICD-10-CM

## 2015-05-11 DIAGNOSIS — B3731 Acute candidiasis of vulva and vagina: Secondary | ICD-10-CM

## 2015-05-11 MED ORDER — CLOTRIMAZOLE 1 % VA CREA: 1.0000 | TOPICAL_CREAM | Freq: Every day | VAGINAL | Status: DC

## 2015-05-11 NOTE — Progress Notes (Signed)
Patient ID: Stacey Keith, female   DOB: 05/23/1986, 29 y.o.   MRN: [id number] Subjective:     Stacey Keith is a 29 y.o. G42P1011 female who presents for a postpartum visit. She is 5 weeks postpartum following a low transverse cesarean section due to fetal bradycardia in setting of persistent  transverse brow presentation. I have fully reviewed the prenatal and intrapartum course. The delivery was at 41 gestational weeks.  Anesthesia: epidural. Postpartum course has been largely uncomplicated; had mastitis two weeks ago thaty was treated with Keflex.  Now reports having vaginal irritation x 1 week. Baby's course has been uncomplicated. Baby is feeding by breast. Bleeding staining only. Bowel function is normal. Bladder function is normal. Patient is sexually active. Contraception method is none. Postpartum depression screening: negative.  The following portions of the patient's history were reviewed and updated as appropriate: allergies, current medications, past family history, past medical history, past social history, past surgical history and problem list.  Normal pap in 05/21/2013.  Review of Systems Pertinent items are noted in HPI.   Objective:    BP 119/73 mmHg  Pulse 80  Temp(Src) 98.5 F (36.9 C)  Ht  (1.549 m)  Wt 176 lb 11.2 oz (80.151 kg)  BMI 33.40 kg/m2  Breastfeeding? Yes General:  alert and no distress   Breasts:  inspection negative, no nipple discharge or bleeding, no masses or nodularity palpable  Lungs: clear to auscultation bilaterally  Heart:  regular rate and rhythm  Abdomen: soft, non-tender; bowel sounds normal; no masses,  no organomegaly. Incision C/D/I.   Vulva:  Erythema noted, white discharge seen, Wet prep obtained.      Assessment:   Normal postpartum exam. Pap smear not done at today's visit.   Plan:   1. Contraception: none 2. Vaginal discharge: Likely yeast given recent antibiotics. Clotrimazole cream prescribed 3. Follow up as needed.     Jaynie Collins, MD, FACOG Attending Obstetrician & Gynecologist Faculty Practice, [facility]

## 2015-05-11 NOTE — Patient Instructions (Signed)
Return to clinic for any scheduled appointments or for any gynecologic concerns as needed.   

## 2015-05-11 NOTE — Progress Notes (Signed)
C/o vaginal irritation -itchy and sore.

## 2015-05-12 LAB — WET PREP, GENITAL
CLUE CELLS WET PREP: NONE SEEN
TRICH WET PREP: NONE SEEN
Yeast Wet Prep HPF POC: NONE SEEN

## 2015-10-20 ENCOUNTER — Ambulatory Visit (INDEPENDENT_AMBULATORY_CARE_PROVIDER_SITE_OTHER): Payer: BLUE CROSS/BLUE SHIELD | Admitting: Osteopathic Medicine

## 2015-10-20 ENCOUNTER — Encounter: Payer: Self-pay | Admitting: Osteopathic Medicine

## 2015-10-20 VITALS — BP 121/83 | HR 68 | Ht 62.0 in | Wt 161.0 lb

## 2015-10-20 DIAGNOSIS — Z13220 Encounter for screening for lipoid disorders: Secondary | ICD-10-CM

## 2015-10-20 DIAGNOSIS — Z0000 Encounter for general adult medical examination without abnormal findings: Secondary | ICD-10-CM

## 2015-10-20 NOTE — Progress Notes (Signed)
HPI: Stacey Keith is a 30 y.o. female who [facility] today for chief complaint of:  Chief Complaint  Patient presents with  . Establish Care    "check blood work and overall health"    Overall healthy, 6 months postpartum and doing well. Is breast-feeding. Underwent emergency C-section due to abnormal presentation, no history of gestational diabetes or hypertension. History of miscarriage due to uterine septum but this was corrected. Care reviewed as below, no additional complaints today.  FEMALE PREVENTIVE CARE  ANNUAL SCREENING/COUNSELING Tobacco - Never  Alcohol - none Diet/Exercise - HEALTHY HABITS DISCUSSED TO DECREASE CV RISK, walking  Sexual Health - Yes with female. STI - The patient denies history of sexually transmitted disease. INTERESTED IN STI TESTING - no Depression - PQH2 Negative Domestic violence concerns - no HTN SCREENING - SEE VITALS Vaccination status - SEE BELOW  INFECTIOUS DISEASE SCREENING HIV - all adults 15-65 - does not need GC/CT - sexually active - does not need HepC - born 87-1963 - does not need TB - if risk/required by employer - does not need  DISEASE SCREENING Lipid - (Low risk screen M35/F45; High risk screen M25/F35 if HTN, Tob, FH CHD M<55/F<65) - needs DM2 (45+ or Risk = FH 1st deg DM, Hx GDM, overweight/sedentary, high-risk ethnicity, HTN) - needs Osteoporosis - age 98+ or one sooner if risk - does not need  CANCER SCREENING Cervical - Pap q3 yr age 8+, Pap + HPV q5y age 24+ - PAP - does not need Breast - Mammo age 54+ (C) and biennial age 27-75 (A) - MAMMO - does not need Lung - annual low dose CT Chest age 38-75 w/ 30+ PY, current/quit past 15 years - CT - does not need Colon - age 30+ or 28 years of age prior to FH Dx - GI REFERRAL - does not need  ADULT VACCINATION Influenza - annual - was offered and declined by the patient Td booster every 10 years - already has HPV - age <17yo -  she does not think she has had this series of vaccines, last Pap test was normal  Zoster - age 71+ - was not indicated Pneumonia - age 98+ sooner if risk (DM, smoker, other) - was not indicated  Past medical, social and family history reviewed: Past Medical History  Diagnosis Date  . Medical history non-contributory   . Mastitis 04/2015   Past Surgical History  Procedure Laterality Date  . Uterine septum resection  04/27/2014  . Cesarean section N/A 04/07/2015    Procedure: CESAREAN SECTION;  Surgeon: Reva Bores, MD;  Location: [facility];  Service: Obstetrics;  Laterality: N/A;   Social History  Substance Use Topics  . Smoking status: Never Smoker   . Smokeless tobacco: Never Used  . Alcohol Use: No   Family History  Problem Relation Age of Onset  . Breast cancer Paternal Grandmother     Current Outpatient Prescriptions  Medication Sig Dispense Refill  . ibuprofen (ADVIL,MOTRIN) 200 MG tablet Take 600 mg by mouth every 6 (six) hours as needed for fever or mild pain.    . Prenatal Vit-Fe Fumarate-FA (PRENATAL MULTIVITAMIN) TABS tablet Take 1 tablet by mouth daily at 12 noon.    . clotrimazole (GYNE-LOTRIMIN) 1 % vaginal cream Place 1 Applicatorful vaginally at bedtime. (Patient not taking: Reported on 10/20/2015) 30 g 2  . ferrous sulfate 325 (65 FE) MG tablet Take 1 tablet (325 mg total) by mouth daily. (  Patient not taking: Reported on 10/20/2015) 30 tablet 3   No current facility-administered medications for this visit.   No Known Allergies    Review of Systems: CONSTITUTIONAL:  No  fever, no chills, No  unintentional weight changes HEAD/EYES/EARS/NOSE/THROAT: No  headache, no vision change, no hearing change, No  sore throat, No  sinus pressure CARDIAC: No  chest pain, No  pressure, No palpitations, No  orthopnea RESPIRATORY: No  cough, No  shortness of breath/wheeze GASTROINTESTINAL: No  nausea, No  vomiting, No  abdominal pain, No  blood in stool, No  diarrhea, No   constipation  MUSCULOSKELETAL: No  myalgia/arthralgia GENITOURINARY: No  incontinence, No  abnormal genital bleeding/discharge SKIN: No  rash/wounds/concerning lesions HEM/ONC: No  easy bruising/bleeding, No  abnormal lymph node ENDOCRINE: No polyuria/polydipsia/polyphagia, No  heat/cold intolerance  NEUROLOGIC: No  weakness, No  dizziness, No  slurred speech PSYCHIATRIC: No  concerns with depression, No  concerns with anxiety, No sleep problems  Exam:  BP 121/83 mmHg  Pulse 68  Ht  (1.575 m)  Wt 161 lb (73.029 kg)  BMI 29.44 kg/m2 Constitutional: VS see above. General Appearance: alert, well-developed, well-nourished, NAD Eyes: Normal lids and conjunctive, non-icteric sclera, PERRLA Ears, Nose, Mouth, Throat: MMM, Normal external inspection ears/nares/mouth/lips/gums, TM normal bilaterally. Pharynx no erythema, no exudate.  Neck: No masses, trachea midline. No thyroid enlargement/tenderness/mass appreciated. No lymphadenopathy Respiratory: Normal respiratory effort. no wheeze, no rhonchi, no rales Cardiovascular: S1/S2 normal, no murmur, no rub/gallop auscultated. RRR. No lower extremity edema. Gastrointestinal: Nontender, no masses. No hepatomegaly, no splenomegaly. No hernia appreciated. Bowel sounds normal. Rectal exam deferred.  Musculoskeletal: Gait normal. No clubbing/cyanosis of digits.  Neurological: No cranial nerve deficit on limited exam. Motor and sensation intact and symmetric Skin: warm, dry, intact. No rash/ulcer. No concerning nevi or subq nodules on limited exam.   Psychiatric: Normal judgment/insight. Normal mood and affect. Oriented x3.    ASSESSMENT/PLAN: Will be due for Pap in 05/2016, otherwise followup as needed/annual physical.   Annual physical exam - Plan: CBC, COMPLETE METABOLIC PANEL WITH GFR, TSH  Lipid screening - Plan: Lipid panel   Return in about 7 months (around 05/22/2016), or sooner if needed, for EVERY 3-YEAR PAP TEST.

## 2015-10-21 LAB — CBC
HCT: 38.4 % (ref 36.0–46.0)
HEMOGLOBIN: 12.7 g/dL (ref 12.0–15.0)
MCH: 26.9 pg (ref 26.0–34.0)
MCHC: 33.1 g/dL (ref 30.0–36.0)
MCV: 81.4 fL (ref 78.0–100.0)
MPV: 9.8 fL (ref 8.6–12.4)
PLATELETS: 301 10*3/uL (ref 150–400)
RBC: 4.72 MIL/uL (ref 3.87–5.11)
RDW: 14.1 % (ref 11.5–15.5)
WBC: 6.6 10*3/uL (ref 4.0–10.5)

## 2015-10-21 LAB — LIPID PANEL
Cholesterol: 235 mg/dL — ABNORMAL HIGH (ref 125–200)
HDL: 51 mg/dL (ref >=46)
LDL CALC: 163 mg/dL — AB (ref <130)
TRIGLYCERIDES: 105 mg/dL (ref <150)
Total CHOL/HDL Ratio: 4.6 Ratio (ref <=5.0)
VLDL: 21 mg/dL (ref <30)

## 2015-10-21 LAB — COMPLETE METABOLIC PANEL WITH GFR
ALT: 14 U/L (ref 6–29)
AST: 16 U/L (ref 10–30)
Albumin: 4.3 g/dL (ref 3.6–5.1)
Alkaline Phosphatase: 59 U/L (ref 33–115)
BUN: 17 mg/dL (ref 7–25)
CO2: 23 mmol/L (ref 20–31)
CREATININE: 0.61 mg/dL (ref 0.50–1.10)
Calcium: 9.5 mg/dL (ref 8.6–10.2)
Chloride: 105 mmol/L (ref 98–110)
GFR, Est Non African American: >89 mL/min (ref >=60)
GLUCOSE: 77 mg/dL (ref 65–99)
Potassium: 4.2 mmol/L (ref 3.5–5.3)
Sodium: 140 mmol/L (ref 135–146)
TOTAL PROTEIN: 7 g/dL (ref 6.1–8.1)
Total Bilirubin: 0.5 mg/dL (ref 0.2–1.2)

## 2015-10-21 LAB — TSH: TSH: 1.123 u[IU]/mL (ref 0.350–4.500)

## 2015-11-23 ENCOUNTER — Encounter: Payer: Self-pay | Admitting: *Deleted

## 2016-02-16 ENCOUNTER — Encounter (HOSPITAL_COMMUNITY): Payer: Self-pay | Admitting: Emergency Medicine

## 2016-02-16 ENCOUNTER — Ambulatory Visit (HOSPITAL_COMMUNITY)
Admission: EM | Admit: 2016-02-16 | Discharge: 2016-02-16 | Disposition: A | Discharge status: Home / Self Care | Payer: BLUE CROSS/BLUE SHIELD | Attending: Emergency Medicine | Admitting: Emergency Medicine

## 2016-02-16 DIAGNOSIS — H66001 Acute suppurative otitis media without spontaneous rupture of ear drum, right ear: Secondary | ICD-10-CM | POA: Diagnosis not present

## 2016-02-16 DIAGNOSIS — H6091 Unspecified otitis externa, right ear: Secondary | ICD-10-CM | POA: Diagnosis not present

## 2016-02-16 MED ORDER — NEOMYCIN-POLYMYXIN-HC 3.5-10000-1 OT SUSP: 4.0000 [drp] | Freq: Four times a day (QID) | OTIC | Status: DC

## 2016-02-16 MED ORDER — CEFDINIR 300 MG PO CAPS: 300.0000 mg | ORAL_CAPSULE | Freq: Two times a day (BID) | ORAL | Status: DC

## 2016-02-16 NOTE — ED Provider Notes (Signed)
CSN: [id number]     Arrival date & time 02/16/16  1315 History   First MD Initiated Contact with Patient 02/16/16 1358     Chief Complaint  Patient presents with  . Otalgia   (Consider location/radiation/quality/duration/timing/severity/associated sxs/prior Treatment) HPI She is a 30 year old woman here for evaluation of right ear pain. She states she has felt like there is fluid in her ear for several weeks, ever since using earpieces at work. In the last 2 days, she has started to have pain in the right ear. No fevers or decreased hearing. She does have an extensive history of ear infections. She also reports having chronic swimmer's ear. She states this was cleaned out about 5 years ago by an ENT doctor and she hasn't had any trouble with her ears since then.  Past Medical History  Diagnosis Date  . Medical history non-contributory   . Mastitis 04/2015   Past Surgical History  Procedure Laterality Date  . Uterine septum resection  04/27/2014  . Cesarean section N/A 04/07/2015    Procedure: CESAREAN SECTION;  Surgeon: Reva Boresanya S [name], MD;  Location: [facility];  Service: Obstetrics;  Laterality: N/A;  . Abdominal hysterectomy    . Ear tube removal Right     20 YEARS AGO   Family History  Problem Relation Age of Onset  . Breast cancer Paternal Grandmother   . Cancer Paternal Grandmother     BREAST  . Cancer Maternal Grandmother     BREAST  . Stroke Maternal Grandmother   . Cancer Maternal Grandfather     KIDNEY  . Cancer Paternal Grandfather     PROSTATE   Social History  Substance Use Topics  . Smoking status: Never Smoker   . Smokeless tobacco: Never Used  . Alcohol Use: No   OB History    Gravida Para Term Preterm AB TAB SAB Ectopic Multiple Living   2 1 1  1  0 1 0 0 1     Review of Systems As in history of present illness Allergies  Review of patient's allergies indicates no known allergies.  Home Medications   Prior to Admission medications   Medication Sig Start  Date End Date Taking? Authorizing Provider  cefdinir (OMNICEF) 300 MG capsule Take 1 capsule (300 mg total) by mouth 2 (two) times daily. 02/16/16   Charm RingsErin J Honig, MD  ibuprofen (ADVIL,MOTRIN) 200 MG tablet Take 600 mg by mouth every 6 (six) hours as needed for fever or mild pain.    Historical Provider, MD  neomycin-polymyxin-hydrocortisone (CORTISPORIN) 3.5-10000-1 otic suspension Place 4 drops into the right ear 4 (four) times daily. 02/16/16   Charm RingsErin J Honig, MD  Prenatal Vit-Fe Fumarate-FA (PRENATAL MULTIVITAMIN) TABS tablet Take 1 tablet by mouth daily at 12 noon.    Historical Provider, MD   Meds Ordered and Administered this Visit  Medications - No data to display  BP 116/79 mmHg  Pulse 88  Temp(Src) 98.7 F (37.1 C) (Oral)  Resp 16  SpO2 100% No data found.   Physical Exam  Constitutional: She is oriented to person, place, and time. She appears well-developed and well-nourished. No distress.  HENT:  Left ear canal and TM normal. Right ear canal is swollen and erythematous with purulent drainage. Visualized portion of TM is erythematous and bulging. No obvious perforation.  Cardiovascular: Normal rate.   Pulmonary/Chest: Effort normal.  Neurological: She is alert and oriented to person, place, and time.    ED Course  Procedures (including critical care  time)  Labs Review Labs Reviewed - No data to display  Imaging Review No results found.   MDM   1. Acute suppurative otitis media of right ear without spontaneous rupture of tympanic membrane, recurrence not specified   2. Otitis externa, right    Treat with Omnicef and Cortisporin drops. Work note provided stating she cannot wear an ear piece. Follow-up as needed.    Charm Rings, MD 02/16/16 [id number]

## 2016-02-16 NOTE — ED Notes (Signed)
Right ear pain, swimmers ear for 2 weeks, hurting for 2 days

## 2016-02-16 NOTE — Discharge Instructions (Signed)
You have both a middle and external ear infection. Take Omnicef twice a day for 10 days. Use the Cortisporin drops 4 times a day for the next week. This should start to get better in the next 2 days. Follow-up as needed.

## 2016-05-17 ENCOUNTER — Ambulatory Visit: Payer: BLUE CROSS/BLUE SHIELD | Admitting: Osteopathic Medicine

## 2016-05-22 ENCOUNTER — Encounter: Payer: Self-pay | Admitting: Obstetrics & Gynecology

## 2016-05-22 ENCOUNTER — Ambulatory Visit (INDEPENDENT_AMBULATORY_CARE_PROVIDER_SITE_OTHER): Payer: BLUE CROSS/BLUE SHIELD | Admitting: Obstetrics & Gynecology

## 2016-05-22 VITALS — BP 124/87 | HR 89 | Ht 62.0 in | Wt 163.0 lb

## 2016-05-22 DIAGNOSIS — Z01419 Encounter for gynecological examination (general) (routine) without abnormal findings: Secondary | ICD-10-CM | POA: Diagnosis not present

## 2016-05-22 DIAGNOSIS — Z124 Encounter for screening for malignant neoplasm of cervix: Secondary | ICD-10-CM

## 2016-05-22 NOTE — Progress Notes (Signed)
Subjective:    Stacey Keith is a 30 y.o. 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 The patient has no complaints today. The patient is sexually active. GYN screening history: last pap: was normal. The patient wears seatbelts: yes. The patient participates in regular exercise: yes. Has the patient ever been transfused or tattooed?: no. The patient reports that there is not domestic violence in her life.   Menstrual History: OB History    Gravida Para Term Preterm AB Living   2 1 1   1 1    SAB TAB Ectopic Multiple Live Births   1 0 0 0 1      Menarche age: [age] Patient's last menstrual period was 06/25/2014 (exact date).    The following portions of the patient's history were reviewed and updated as appropriate: allergies, current medications, past family history, past medical history, past social history, past surgical history and problem list.  Review of Systems Pertinent items are noted in HPI.   Married for 6 years, denies dyspareunia. Not using contraception as it took 3 years and femara to concieve with last pregnancy (also had resection of uterine septum). Works at [employer] part time Stopped breastfeeding last week Had oligomenorrhea prior to pregnancy Objective:    BP 124/87   Pulse 89   Ht 5\' 2"  (1.575 m)   Wt 163 lb (73.9 kg)   LMP 06/25/2014 (Exact Date) Comment: Pt isn't sure why she doesn't have regular periods but was told by a doctor it could possibly be PCOS but it was never confirmed/diagnosed  Breastfeeding? No   BMI 29.81 kg/m   General Appearance:    Alert, cooperative, no distress, appears stated age  Head:    Normocephalic, without obvious abnormality, atraumatic  Eyes:    PERRL, conjunctiva/corneas clear, EOM's intact, fundi    benign, both eyes  Ears:    Normal TM's and external ear canals, both ears  Nose:   Nares normal, septum midline, mucosa normal, no drainage    or sinus tenderness  Throat:   Lips, mucosa, and tongue normal;  teeth and gums normal  Neck:   Supple, symmetrical, trachea midline, no adenopathy;    thyroid:  no enlargement/tenderness/nodules; no carotid   bruit or JVD  Back:     Symmetric, no curvature, ROM normal, no CVA tenderness  Lungs:     Clear to auscultation bilaterally, respirations unlabored  Chest Wall:    No tenderness or deformity   Heart:    Regular rate and rhythm, S1 and S2 normal, no murmur, rub   or gallop  Breast Exam:    No tenderness, masses, or nipple abnormality  Abdomen:     Soft, non-tender, bowel sounds active all four quadrants,    no masses, no organomegaly, obese, hirsuite  Genitalia:    Normal female without lesion, discharge or tenderness, NSSA, NT, no palpable adnexal masses     Extremities:   Extremities normal, atraumatic, no cyanosis or edema  Pulses:   2+ and symmetric all extremities  Skin:   Skin color, texture, turgor normal, no rashes or lesions  Lymph nodes:   Cervical, supraclavicular, and axillary nodes normal  Neurologic:   CNII-XII intact, normal strength, sensation and reflexes    throughout   .    Assessment:    Healthy female exam.    Plan:     Thin prep Pap smear.   Continue PNVs She may need a repeat course of provera/femara  when she is ready to conceive

## 2016-05-24 LAB — CYTOLOGY - PAP

## 2016-09-12 ENCOUNTER — Encounter: Payer: Self-pay | Admitting: Obstetrics & Gynecology

## 2016-10-04 ENCOUNTER — Encounter: Payer: Self-pay | Admitting: Obstetrics & Gynecology

## 2016-10-04 ENCOUNTER — Ambulatory Visit (INDEPENDENT_AMBULATORY_CARE_PROVIDER_SITE_OTHER): Payer: BLUE CROSS/BLUE SHIELD | Admitting: Obstetrics & Gynecology

## 2016-10-04 VITALS — BP 129/89 | HR 106 | Wt 181.0 lb

## 2016-10-04 DIAGNOSIS — Z3689 Encounter for other specified antenatal screening: Secondary | ICD-10-CM | POA: Diagnosis not present

## 2016-10-04 DIAGNOSIS — Z113 Encounter for screening for infections with a predominantly sexual mode of transmission: Secondary | ICD-10-CM

## 2016-10-04 DIAGNOSIS — Z3A10 10 weeks gestation of pregnancy: Secondary | ICD-10-CM

## 2016-10-04 DIAGNOSIS — O34219 Maternal care for unspecified type scar from previous cesarean delivery: Secondary | ICD-10-CM | POA: Insufficient documentation

## 2016-10-04 DIAGNOSIS — Z3481 Encounter for supervision of other normal pregnancy, first trimester: Secondary | ICD-10-CM

## 2016-10-04 DIAGNOSIS — Z3480 Encounter for supervision of other normal pregnancy, unspecified trimester: Secondary | ICD-10-CM | POA: Insufficient documentation

## 2016-10-04 DIAGNOSIS — O99211 Obesity complicating pregnancy, first trimester: Secondary | ICD-10-CM

## 2016-10-04 DIAGNOSIS — E669 Obesity, unspecified: Secondary | ICD-10-CM

## 2016-10-04 DIAGNOSIS — O99210 Obesity complicating pregnancy, unspecified trimester: Secondary | ICD-10-CM | POA: Insufficient documentation

## 2016-10-04 DIAGNOSIS — Z23 Encounter for immunization: Secondary | ICD-10-CM

## 2016-10-04 NOTE — Progress Notes (Signed)
Bedside U/S shows IUP with FHT of 168 BPM  CRL is 38.38.753mm  GA is 3148w4d

## 2016-10-04 NOTE — Progress Notes (Signed)
  Subjective:    Stacey Keith is a MW [id number] (6318 month old son) at  2928w2d being seen today for her first obstetrical visit.  Her obstetrical history is significant for obesity. Patient does intend to breast feed. Pregnancy history fully reviewed.  Patient reports no complaints.  Vitals:   10/04/16 1332  BP: 129/89  Pulse: (!) 106  Weight: 181 lb (82.1 kg)    HISTORY: OB History  Gravida Para Term Preterm AB Living  3 1 1   1 1   SAB TAB Ectopic Multiple Live Births  1 0 0 0 1    # Outcome Date GA Lbr Len/2nd Weight Sex Delivery Anes PTL Lv  3 Current           2 Term 04/07/15 7762w1d  7 lb 10.8 oz (3.48 kg) M CS-LTranv EPI  LIV     Birth Comments: Hgb, Normal, FA Newborn Screen Barcode: [id number] Date Collected: 04/08/2015  1 SAB 10/2013             Past Medical History:  Diagnosis Date  . Mastitis 04/2015  . Medical history non-contributory    Past Surgical History:  Procedure Laterality Date  . ABDOMINAL HYSTERECTOMY    . CESAREAN SECTION N/A 04/07/2015   Procedure: CESAREAN SECTION;  Surgeon: Reva Boresanya S [name], MD;  Location: [facility];  Service: Obstetrics;  Laterality: N/A;  . EAR TUBE REMOVAL Right    20 YEARS AGO  . UTERINE SEPTUM RESECTION  04/27/2014   Family History  Problem Relation Age of Onset  . Breast cancer Paternal Grandmother   . Cancer Paternal Grandmother     BREAST  . Cancer Maternal Grandmother     BREAST  . Stroke Maternal Grandmother   . Cancer Maternal Grandfather     KIDNEY  . Cancer Paternal Grandfather     PROSTATE     Exam    Uterus:     Pelvic Exam:    Perineum: No Hemorrhoids   Vulva: normal   Vagina:  normal mucosa   pH:    Cervix: anteverted   Adnexa: normal adnexa   Bony Pelvis: android  System: Breast:  normal appearance, no masses or tenderness   Skin: normal coloration and turgor, no rashes    Neurologic: oriented   Extremities: normal strength, tone, and muscle mass   HEENT PERRLA   Mouth/Teeth mucous membranes  moist, pharynx normal without lesions   Neck supple   Cardiovascular: regular rate and rhythm   Respiratory:  appears well, vitals normal, no respiratory distress, acyanotic, normal RR, ear and throat exam is normal, neck free of mass or lymphadenopathy, chest clear, no wheezing, crepitations, rhonchi, normal symmetric air entry   Abdomen: soft, non-tender; bowel sounds normal; no masses,  no organomegaly   Urinary: urethral meatus normal      Assessment:    Pregnancy: [id number] Patient Active Problem List   Diagnosis Date Noted  . Septate uterus 10/12/2014  . Oligomenorrhea 05/23/2013  . Hirsutism 05/23/2013        Plan:     Initial labs drawn. Prenatal vitamins. Problem list reviewed and updated. Genetic Screening discussed Quad Screen: requested. (will be drawn at her 20 week visit as she opts for [employer])  Ultrasound discussed; fetal survey: ordered. For [redacted] weeks EGA  Follow up in at [redacted] weeks EGA . Flu vaccine today We discussed rec'd weight gain and risks of extra weight gain including stillbirth.    Allie BossierMyra C Emilian Stawicki 10/04/2016

## 2016-10-05 LAB — PAIN MGMT, PROFILE 6 CONF W/O MM, U
6 Acetylmorphine: NEGATIVE ng/mL (ref <10)
Alcohol Metabolites: NEGATIVE ng/mL (ref <500)
Amphetamines: NEGATIVE ng/mL (ref <500)
BENZODIAZEPINES: NEGATIVE ng/mL (ref <100)
Barbiturates: NEGATIVE ng/mL (ref <300)
COCAINE METABOLITE: NEGATIVE ng/mL (ref <150)
CREATININE: 22.4 mg/dL (ref >=20.0)
METHADONE METABOLITE: NEGATIVE ng/mL (ref <100)
Marijuana Metabolite: NEGATIVE ng/mL (ref <20)
OXYCODONE: NEGATIVE ng/mL (ref <100)
Opiates: NEGATIVE ng/mL (ref <100)
Oxidant: NEGATIVE ug/mL (ref <200)
PH: 6.52 (ref 4.5–9.0)
PHENCYCLIDINE: NEGATIVE ng/mL (ref <25)
Please note:: 0

## 2016-10-05 LAB — PRENATAL PROFILE (SOLSTAS)
Antibody Screen: NEGATIVE
Basophils Absolute: 0 cells/uL (ref 0–200)
Basophils Relative: 0 %
Eosinophils Absolute: 101 cells/uL (ref 15–500)
Eosinophils Relative: 1 %
HEMATOCRIT: 38.3 % (ref 35.0–45.0)
HEMOGLOBIN: 12.7 g/dL (ref 11.7–15.5)
HIV 1&2 Ab, 4th Generation: NONREACTIVE
Hepatitis B Surface Ag: NEGATIVE
LYMPHS PCT: 35 %
Lymphs Abs: 3535 cells/uL (ref 850–3900)
MCH: 28 pg (ref 27.0–33.0)
MCHC: 33.2 g/dL (ref 32.0–36.0)
MCV: 84.4 fL (ref 80.0–100.0)
MPV: 8.9 fL (ref 7.5–12.5)
Monocytes Absolute: 606 cells/uL (ref 200–950)
Monocytes Relative: 6 %
NEUTROS PCT: 58 %
Neutro Abs: 5858 cells/uL (ref 1500–7800)
Platelets: 356 10*3/uL (ref 140–400)
RBC: 4.54 MIL/uL (ref 3.80–5.10)
RDW: 14.9 % (ref 11.0–15.0)
Rh Type: POSITIVE
Rubella: 2.58 Index — ABNORMAL HIGH (ref <0.90)
WBC: 10.1 10*3/uL (ref 3.8–10.8)

## 2016-10-05 LAB — GLUCOSE, RANDOM: Glucose, Bld: 109 mg/dL — ABNORMAL HIGH (ref 65–99)

## 2016-10-05 LAB — HEMOGLOBIN A1C
Hgb A1c MFr Bld: 5 % (ref <5.7)
MEAN PLASMA GLUCOSE: 97 mg/dL

## 2016-10-06 LAB — CULTURE, OB URINE

## 2016-10-08 LAB — GC/CHLAMYDIA PROBE AMP (~~LOC~~) NOT AT ARMC
Chlamydia: NEGATIVE
Neisseria Gonorrhea: NEGATIVE

## 2016-10-09 LAB — CYSTIC FIBROSIS DIAGNOSTIC STUDY: Result Summary:: DETECTED — AB

## 2016-10-11 ENCOUNTER — Ambulatory Visit (INDEPENDENT_AMBULATORY_CARE_PROVIDER_SITE_OTHER): Payer: BLUE CROSS/BLUE SHIELD | Admitting: Osteopathic Medicine

## 2016-10-11 ENCOUNTER — Encounter: Payer: Self-pay | Admitting: Osteopathic Medicine

## 2016-10-11 ENCOUNTER — Encounter: Payer: Self-pay | Admitting: Obstetrics & Gynecology

## 2016-10-11 VITALS — BP 124/82 | HR 87 | Ht 62.0 in | Wt 177.0 lb

## 2016-10-11 DIAGNOSIS — O09891 Supervision of other high risk pregnancies, first trimester: Secondary | ICD-10-CM

## 2016-10-11 DIAGNOSIS — Z0000 Encounter for general adult medical examination without abnormal findings: Secondary | ICD-10-CM | POA: Diagnosis not present

## 2016-10-11 DIAGNOSIS — Z141 Cystic fibrosis carrier: Secondary | ICD-10-CM | POA: Diagnosis not present

## 2016-10-11 NOTE — Progress Notes (Signed)
HPI: Stacey Keith is a 31 y.o. female  who presents to [facility] today, 10/11/16,  for chief complaint of: No chief complaint on file.   Overall healthy, currently pregnant and following with OBGYN. 53 month old son - with that pregnancy she underwent emergency C-section due to abnormal presentation, no history of gestational diabetes or hypertension. History of miscarriage due to uterine septum but this was corrected. Preventive care reviewed as below, no additional complaints today.  FEMALE PREVENTIVE CARE  ANNUAL SCREENING/COUNSELING Tobacco - Never  Alcohol - none Diet/Exercise - HEALTHY HABITS DISCUSSED TO DECREASE CV RISK, walking  Sexual Health - Yes with female. STI - The patient denies history of sexually transmitted disease. INTERESTED IN STI TESTING - no Depression - PQH2 Negative Domestic violence concerns - no HTN SCREENING - SEE VITALS Vaccination status - SEE BELOW  INFECTIOUS DISEASE SCREENING HIV - all adults 15-65 - does not need GC/CT - sexually active - does not need HepC - born 38-1963 - does not need TB - if risk/required by employer - does not need  DISEASE SCREENING Lipid - needs DM2 - needs Osteoporosis - does not need  CANCER SCREENING Cervical - does not need - NILM 05/22/16 Breast - does not need Lung - does not need Colon - does not need  ADULT VACCINATION Influenza - annual - already has Td booster every 10 years - already has HPV - age <71yo - she does not think she has had this series of vaccines, last Pap test was normal  Zoster - age 66+ - was not indicated Pneumonia - age 21+ sooner if risk (DM, smoker, other) - was not indicated   Past medical, surgical, social and family history reviewed: Patient Active Problem List   Diagnosis Date Noted  . Supervision of other normal pregnancy, antepartum 10/04/2016  . Previous cesarean delivery, delivered 10/04/2016  . Obesity in pregnancy 10/04/2016  .  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 Septate uterus 10/12/2014  . Oligomenorrhea 05/23/2013  . Hirsutism 05/23/2013   Past Surgical History:  Procedure Laterality Date  . ABDOMINAL HYSTERECTOMY    . CESAREAN SECTION N/A 04/07/2015   Procedure: CESAREAN SECTION;  Surgeon: Reva Bores, MD;  Location: [facility];  Service: Obstetrics;  Laterality: N/A;  . EAR TUBE REMOVAL Right    20 YEARS AGO  . UTERINE SEPTUM RESECTION  04/27/2014   Social History  Substance Use Topics  . Smoking status: Never Smoker  . Smokeless tobacco: Never Used  . Alcohol use No   Family History  Problem Relation Age of Onset  . Breast cancer Paternal Grandmother   . Cancer Paternal Grandmother     BREAST  . Cancer Maternal Grandmother     BREAST  . Stroke Maternal Grandmother   . Cancer Maternal Grandfather     KIDNEY  . Cancer Paternal Grandfather     PROSTATE     Current medication list and allergy/intolerance information reviewed:   Current Outpatient Prescriptions  Medication Sig Dispense Refill  . cefdinir (OMNICEF) 300 MG capsule Take 1 capsule (300 mg total) by mouth 2 (two) times daily. (Patient not taking: Reported on 10/04/2016) 20 capsule 0  . ibuprofen (ADVIL,MOTRIN) 200 MG tablet Take 600 mg by mouth every 6 (six) hours as needed for fever or mild pain.    Marland Kitchen neomycin-polymyxin-hydrocortisone (CORTISPORIN) 3.5-10000-1 otic suspension Place 4 drops into the right ear 4 (four) times daily. (Patient not taking: Reported on  10/04/2016) 10 mL 0  . Prenatal Vit-Fe Fumarate-FA (PRENATAL MULTIVITAMIN) TABS tablet Take 1 tablet by mouth daily at 12 noon.     No current facility-administered medications for this visit.    No Known Allergies    Review of Systems:  Constitutional:  No  fever, no chills, No recent illness, No unintentional weight changes. No significant fatigue.   HEENT: No  headache  Cardiac: No  chest pain, No  pressure, No palpitations  Respiratory:   No  shortness of breath. No  Cough  Gastrointestinal: No  abdominal pain, No  nausea  Musculoskeletal: No new myalgia/arthralgia  Genitourinary: No  incontinence, No  abnormal genital bleeding, No abnormal genital discharge, no cramping  Skin: No  Rash, No other wounds/concerning lesions  Endocrine: No cold intolerance,  No heat intolerance  Neurologic: No  weakness, No  dizzines  Psychiatric: No  concerns with depression, No  concerns with anxiety, No sleep problems, No mood problems  Exam:  BP 124/82   Pulse 87   Ht 5\' 2"  (1.575 m)   Wt 177 lb (80.3 kg)   LMP 07/24/2016 (Approximate) Comment: Pt isn't sure why she doesn't have regular periods but was told by a doctor it could possibly be PCOS but it was never confirmed/diagnosed  BMI 32.37 kg/m   Constitutional: VS see above. General Appearance: alert, well-developed, well-nourished, NAD  Eyes: Normal lids and conjunctive, non-icteric sclera  Ears, Nose, Mouth, Throat: MMM, Normal external inspection ears/nares/mouth/lips/gums. TM normal bilaterally. Pharynx/tonsils no erythema, no exudate. Nasal mucosa normal.   Neck: No masses, trachea midline. No thyroid enlargement. No tenderness/mass appreciated. No lymphadenopathy  Respiratory: Normal respiratory effort. no wheeze, no rhonchi, no rales  Cardiovascular: S1/S2 normal, no murmur, no rub/gallop auscultated. RRR. No lower extremity edema. Pedal pulse II/IV bilaterally DP and PT. No carotid bruit or JVD. No abdominal aortic bruit.  Gastrointestinal: Nontender, no masses. No hepatomegaly, no splenomegaly. No hernia appreciated. Bowel sounds normal. Rectal exam deferred.   Musculoskeletal: Gait normal. No clubbing/cyanosis of digits.   Neurological: Normal balance/coordination. No tremor. No cranial nerve deficit on limited exam. Motor and sensation intact and symmetric. Cerebellar reflexes intact.   Skin: warm, dry, intact. No rash/ulcer. No concerning nevi or subq  nodules on limited exam.    Psychiatric: Normal judgment/insight. Normal mood and affect. Oriented x3.    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 All questions at time of visit were answered - patient instructed to contact office with any additional concerns. ER/RTC precautions were reviewed with the patient. Follow-up plan: [facility] about [facility] (around [facility]2017[facility] sooner if needed.

## 2016-10-19 ENCOUNTER — Ambulatory Visit (INDEPENDENT_AMBULATORY_CARE_PROVIDER_SITE_OTHER): Payer: BLUE CROSS/BLUE SHIELD | Admitting: Family

## 2016-10-19 ENCOUNTER — Encounter: Payer: Self-pay | Admitting: Family

## 2016-10-19 DIAGNOSIS — Z3480 Encounter for supervision of other normal pregnancy, unspecified trimester: Secondary | ICD-10-CM

## 2016-10-19 DIAGNOSIS — O09891 Supervision of other high risk pregnancies, first trimester: Secondary | ICD-10-CM

## 2016-10-19 DIAGNOSIS — Z3481 Encounter for supervision of other normal pregnancy, first trimester: Secondary | ICD-10-CM

## 2016-10-19 DIAGNOSIS — Z141 Cystic fibrosis carrier: Secondary | ICD-10-CM

## 2016-10-19 NOTE — Progress Notes (Signed)
   PRENATAL VISIT NOTE  Subjective:  Stacey Keith is a 31 y.o. G3P1011 at 4214w3d being seen today for ongoing prenatal care.  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  Patient reports nausea and decreased vomiting; symptoms improving.  Contractions: Not present. Vag. Bleeding: None.  Movement: Absent. Denies leaking of fluid.   The following portions of the patient's history were reviewed and updated as appropriate: allergies, current medications, past family history, past medical history, past social history, past surgical history and problem list. Problem list updated.  Objective:   Vitals:   10/19/16 0918  BP: 124/77  Pulse: (!) 109  Weight: 80.7 kg (178 lb)    Fetal Status: Fetal Heart Rate (bpm): 164 Fundal Height: 13 cm Movement: Absent     General:  Alert, oriented and cooperative. Patient is in no acute distress.  Skin: Skin is warm and dry. No rash noted.   Cardiovascular: Normal heart rate noted  Respiratory: Normal respiratory effort, no problems with respiration noted  Abdomen: Soft, gravid, appropriate for gestational age. Pain/Pressure: Absent     Pelvic:  Cervical exam deferred        Extremities: Normal range of motion.  Edema: None  Mental Status: Normal mood and affect. Normal behavior. Normal judgment and thought content.   Assessment and Plan:  Pregnancy: G3P1011 at 7714w3d  1. Supervision of other normal pregnancy, antepartum - Reviewed lab results - Babyscripts, ultrasound already scheduled - Desires Quad  2. 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 Please refer to After Visit Summary for other counseling recommendations.  Return in about 7 weeks (around 12/07/2016) for Appt and quad.   Eino FarberWalidah Kennith GainN Karim, CNM

## 2016-11-16 IMAGING — US US MFM OB TRANSVAGINAL
2 series · 12 of 28 positions shown · non-contrast
Comparison: none

[Series 1: us ob comp +14 wk mfm · 66 acquisitions, 10 frames shown (1 of 2)]
[im 3/66]
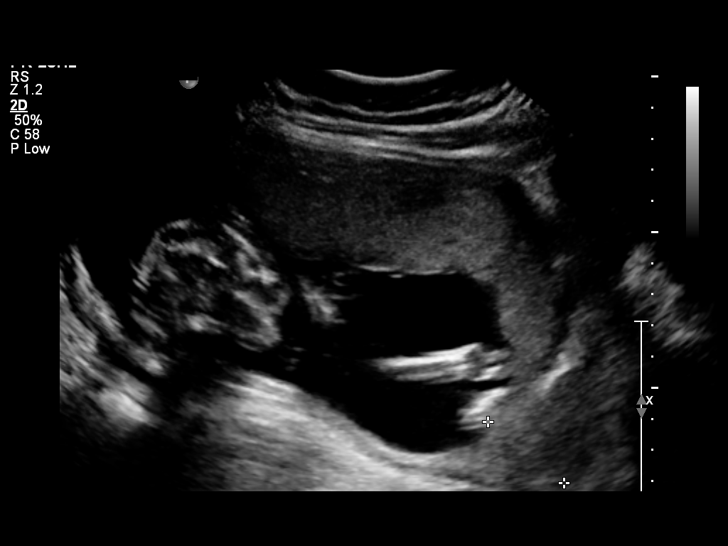
[im 9/66]
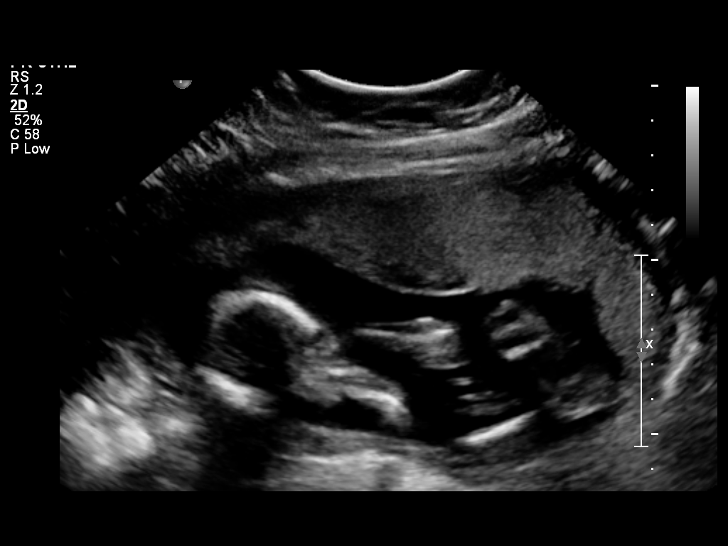
[im 15/66]
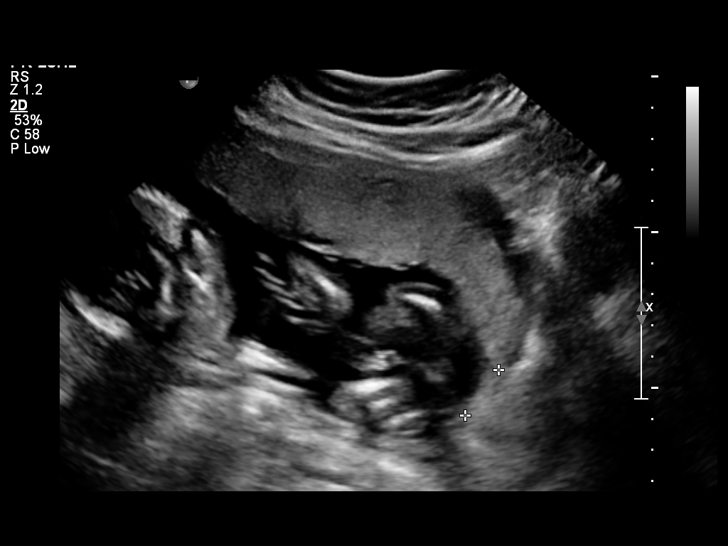
[im 23/66]
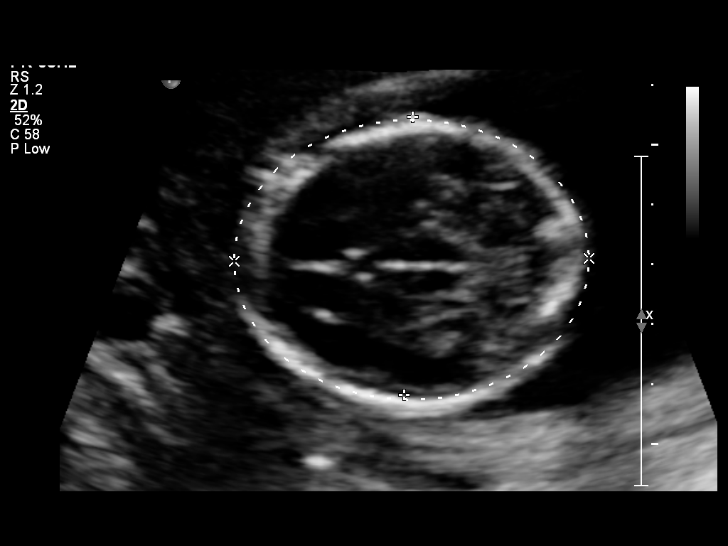
[im 29/66]
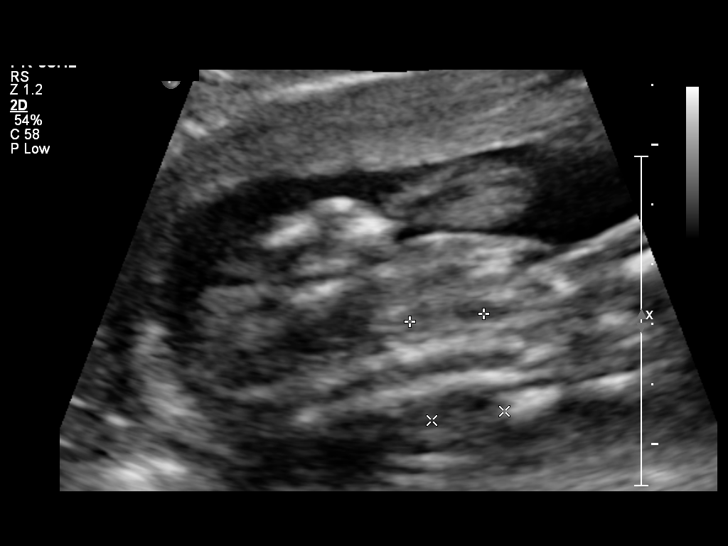
[im 34/66]
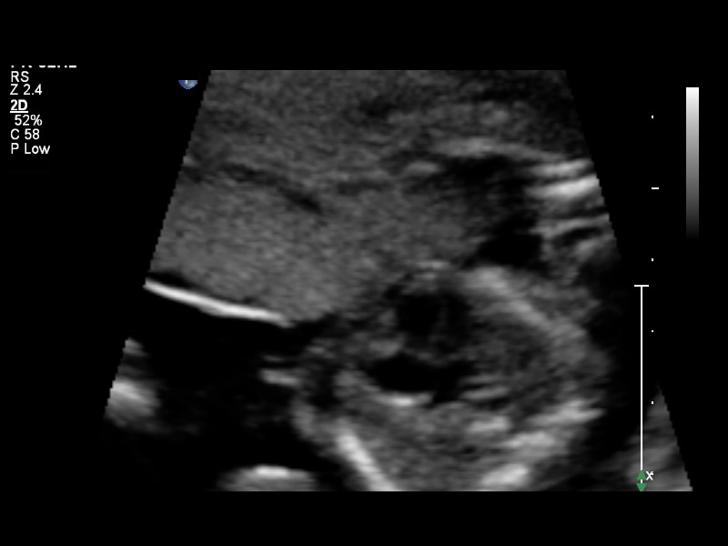
[im 43/66]
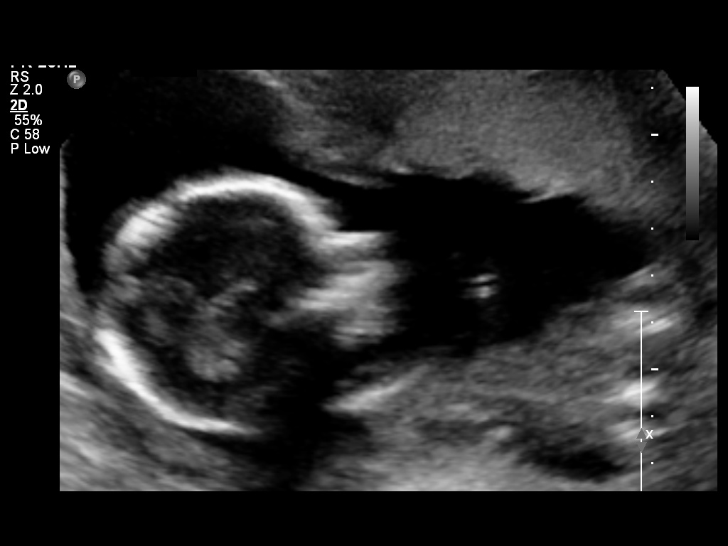
[im 49/66]
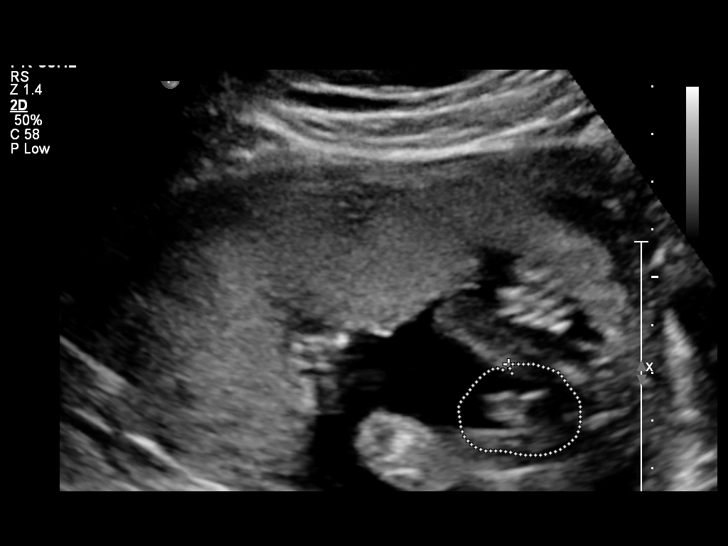
[im 54/66]
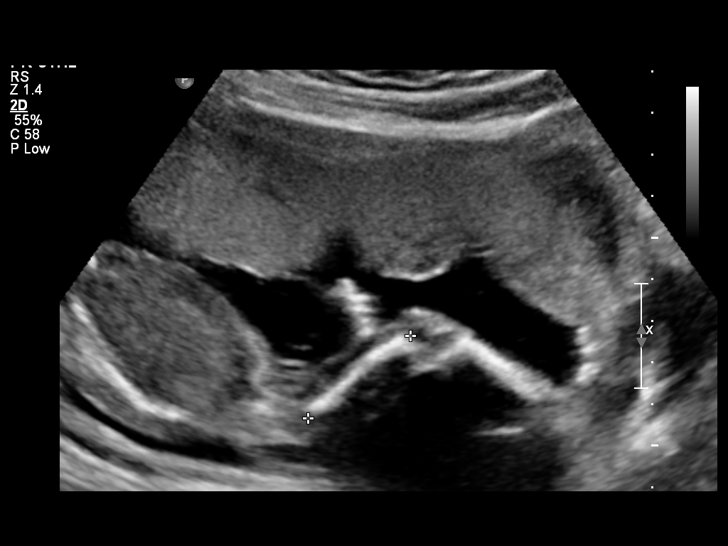
[im 63/66]
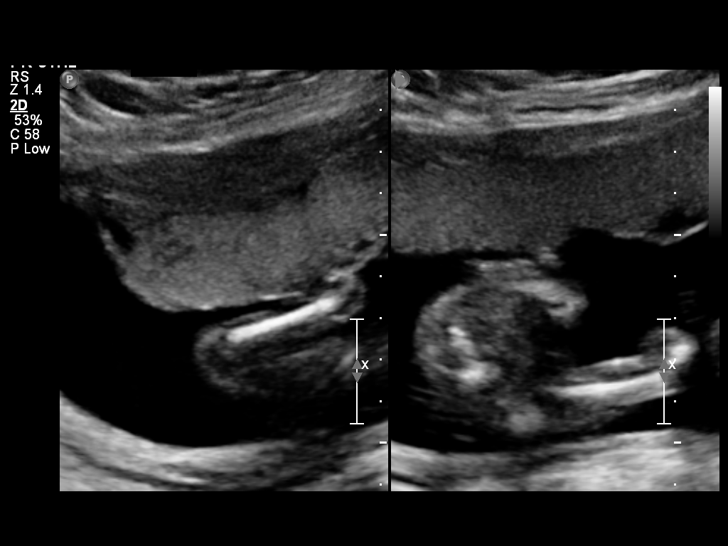

[Series 1: us ob comp +14 wk mfm · 2 of 10 slices shown (2 of 2)]
[im 1/10]
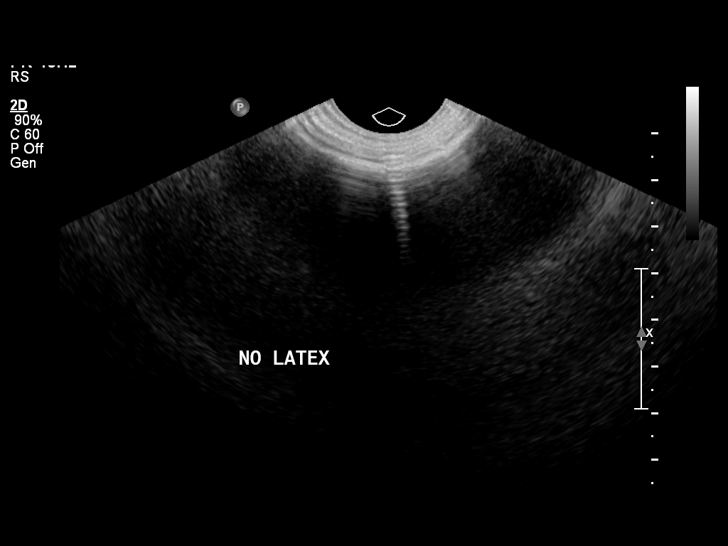
[im 7/10]
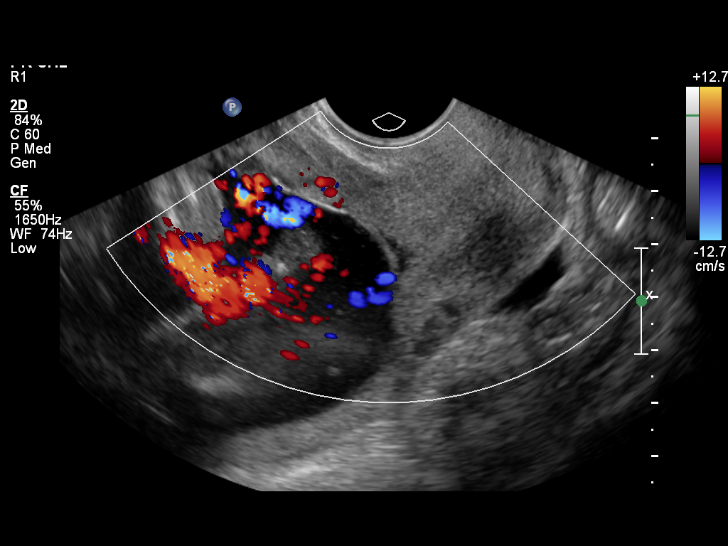

[12 of 28 positions shown; findings below may reference images not displayed]

OBSTETRICS REPORT
                    (Corrected Final 11/08/2014 [DATE])

Service(s) Provided

 US OB COMP + 14 WK                                    76805.1
 US MFM OB TRANSVAGINAL                                76817.2
Indications

 19 weeks gestation of pregnancy
 uterine septum resection [DATE]
 Basic anatomic survey                                 z36
Fetal Evaluation

 Num Of Fetuses:    1
 Fetal Heart Rate:  140                          bpm
 Cardiac Activity:  Observed
 Presentation:      Variable
 Placenta:          Anterior, low-lying,
                    cm from int os
 P. Cord            Visualized
 Insertion:

 Amniotic Fluid
 AFI FV:      Subjectively within normal limits
                                             Larg Pckt:     5.9  cm
Biometry

 BPD:     46.6  mm     G. Age:  20w 1d                CI:        77.31   70 - 86
                                                      FL/HC:      18.6   16.1 -

 HC:     167.8  mm     G. Age:  19w 3d       50  %    HC/AC:      1.20   1.09 -

 AC:     139.8  mm     G. Age:  19w 3d       48  %    FL/BPD:
 FL:      31.2  mm     G. Age:  19w 5d       57  %    FL/AC:      22.3   20 - 24
 HUM:       28  mm     G. Age:  19w 0d       43  %
 CER:     20.4  mm     G. Age:  19w 3d       52  %
 NFT:     3.69  mm

 Est. FW:     298  gm    0 lb 11 oz      51  %
Gestational Age

 LMP:           19w 2d        Date:  06/23/14                 EDD:   03/30/15
 U/S Today:     19w 5d                                        EDD:   03/27/15
 Best:          19w 2d     Det. By:  LMP  (06/23/14)          EDD:   03/30/15
Anatomy

 Cranium:          Appears normal         Aortic Arch:      Appears normal
 Fetal Cavum:      Appears normal         Ductal Arch:      Appears normal
 Ventricles:       Appears normal         Diaphragm:        Not well visualized
 Choroid Plexus:   Appears normal         Stomach:          Appears normal, left
                                                            sided
 Cerebellum:       Appears normal         Abdomen:          Appears normal
 Posterior Fossa:  Appears normal         Abdominal Wall:   Not well visualized
 Nuchal Fold:      Appears normal         Cord Vessels:     Appears normal (3
                                                            vessel cord)
 Face:             Orbits nl; profile not Kidneys:          Appear normal
                   well visualized
 Lips:             Not well visualized    Bladder:          Appears normal
 Heart:            Not well visualized    Spine:            Not well visualized
 RVOT:             Not well visualized    Lower             Appears normal
                                          Extremities:
 LVOT:             Appears normal         Upper             Appears normal
                                          Extremities:

 Other:  Male gender. Heels visualized. Technically difficult due to maternal
         habitus and fetal position.
Targeted Anatomy

 Fetal Central Nervous System
 Cisterna Magna:
Cervix Uterus Adnexa

 Cervical Length:    3.2      cm

 Cervix:       Normal appearance by transabdominal scan.
 Left Ovary:    Not visualized. No adnexal mass visualized.
 Right Ovary:   Not visualized. No adnexal mass visualized.
Impression

 SIUP at 80w5d (remote read)
 EFW 51st%
 No dysmorphic features, limitations as above
 Placenta is low-lying with leading edge 1.4cm from internal os
Recommendations

 Recommend follow up interval growth, reassessment of
 placental location and attempt to complete anatomic survey
 in 6 weeks.

 questions or concerns.
                 Attending Physician, HASONA

## 2016-11-26 ENCOUNTER — Encounter (HOSPITAL_COMMUNITY): Payer: Self-pay | Admitting: Obstetrics & Gynecology

## 2016-12-06 ENCOUNTER — Other Ambulatory Visit: Payer: Self-pay | Admitting: Obstetrics & Gynecology

## 2016-12-06 ENCOUNTER — Ambulatory Visit (HOSPITAL_COMMUNITY)
Admission: RE | Admit: 2016-12-06 | Discharge: 2016-12-06 | Disposition: A | Discharge status: Home / Self Care | Payer: BLUE CROSS/BLUE SHIELD | Source: Ambulatory Visit | Attending: Obstetrics & Gynecology | Admitting: Obstetrics & Gynecology

## 2016-12-06 DIAGNOSIS — Z3689 Encounter for other specified antenatal screening: Secondary | ICD-10-CM

## 2016-12-06 DIAGNOSIS — O34592 Maternal care for other abnormalities of gravid uterus, second trimester: Secondary | ICD-10-CM | POA: Diagnosis not present

## 2016-12-06 DIAGNOSIS — O09892 Supervision of other high risk pregnancies, second trimester: Secondary | ICD-10-CM | POA: Diagnosis not present

## 2016-12-06 DIAGNOSIS — O99212 Obesity complicating pregnancy, second trimester: Secondary | ICD-10-CM

## 2016-12-06 DIAGNOSIS — Z3480 Encounter for supervision of other normal pregnancy, unspecified trimester: Secondary | ICD-10-CM

## 2016-12-06 DIAGNOSIS — Z3A19 19 weeks gestation of pregnancy: Secondary | ICD-10-CM | POA: Diagnosis not present

## 2016-12-06 DIAGNOSIS — Z141 Cystic fibrosis carrier: Secondary | ICD-10-CM

## 2016-12-06 DIAGNOSIS — O34219 Maternal care for unspecified type scar from previous cesarean delivery: Secondary | ICD-10-CM

## 2016-12-06 DIAGNOSIS — O34211 Maternal care for low transverse scar from previous cesarean delivery: Secondary | ICD-10-CM | POA: Insufficient documentation

## 2016-12-07 ENCOUNTER — Ambulatory Visit (INDEPENDENT_AMBULATORY_CARE_PROVIDER_SITE_OTHER): Payer: BLUE CROSS/BLUE SHIELD | Admitting: Advanced Practice Midwife

## 2016-12-07 VITALS — BP 104/68 | HR 84 | Wt 181.0 lb

## 2016-12-07 DIAGNOSIS — Q5128 Other doubling of uterus, other specified: Secondary | ICD-10-CM

## 2016-12-07 DIAGNOSIS — Z141 Cystic fibrosis carrier: Secondary | ICD-10-CM

## 2016-12-07 DIAGNOSIS — Z3480 Encounter for supervision of other normal pregnancy, unspecified trimester: Secondary | ICD-10-CM

## 2016-12-07 DIAGNOSIS — Z3492 Encounter for supervision of normal pregnancy, unspecified, second trimester: Secondary | ICD-10-CM

## 2016-12-07 DIAGNOSIS — IMO0002 Reserved for concepts with insufficient information to code with codable children: Secondary | ICD-10-CM

## 2016-12-07 DIAGNOSIS — Q512 Other doubling of uterus, unspecified: Secondary | ICD-10-CM

## 2016-12-07 DIAGNOSIS — O09891 Supervision of other high risk pregnancies, first trimester: Secondary | ICD-10-CM

## 2016-12-07 DIAGNOSIS — Z0489 Encounter for examination and observation for other specified reasons: Secondary | ICD-10-CM

## 2016-12-07 DIAGNOSIS — O3402 Maternal care for unspecified congenital malformation of uterus, second trimester: Secondary | ICD-10-CM

## 2016-12-07 DIAGNOSIS — Z3482 Encounter for supervision of other normal pregnancy, second trimester: Secondary | ICD-10-CM

## 2016-12-07 NOTE — Patient Instructions (Signed)

## 2016-12-07 NOTE — Progress Notes (Signed)
   PRENATAL VISIT NOTE  Subjective:  Stacey Keith is a 31 y.o. G3P1011 at 149w3d being seen today for ongoing prenatal care.  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  Patient reports no complaints.  Contractions: Not present. Vag. Bleeding: None.  Movement: Present. Denies leaking of fluid.   The following portions of the patient's history were reviewed and updated as appropriate: allergies, current medications, past family history, past medical history, past social history, past surgical history and problem list. Problem list updated.  Objective:   Vitals:   12/07/16 0836  BP: 104/68  Pulse: 84  Weight: 181 lb (82.1 kg)    Fetal Status: Fetal Heart Rate (bpm): 141 Fundal Height: 20 cm Movement: Present     General:  Alert, oriented and cooperative. Patient is in no acute distress.  Skin: Skin is warm and dry. No rash noted.   Cardiovascular: Normal heart rate noted  Respiratory: Normal respiratory effort, no problems with respiration noted  Abdomen: Soft, gravid, appropriate for gestational age. Pain/Pressure: Absent     Pelvic:  Cervical exam deferred        Extremities: Normal range of motion.  Edema: None  Mental Status: Normal mood and affect. Normal behavior. Normal judgment and thought content.   Assessment and Plan:  Pregnancy: G3P1011 at 529w3d  1. Normal pregnancy in second trimester  - AFP, Quad Screen - US MFM OB FOLLOW UP; Future  2. Evaluate anatomy not seen on prior sonogram  - US MFM OB FOLLOW UP; Future  3. Septate uterus affecting pregnancy in second trimester  - US MFM OB FOLLOW UP; Future  4. Supervision of other normal pregnancy, antepartum   Preterm labor symptoms and  general obstetric precautions including but not limited to vaginal bleeding, contractions, leaking of fluid and fetal movement were reviewed in detail with the patient. Please refer to After Visit Summary for other counseling recommendations.  F/U 8 weeks   Dorathy KinsmanVirginia Rakim Moone, [place]

## 2016-12-11 LAB — AFP, QUAD SCREEN
AFP: 34 ng/mL
Age Alone: 1:826 {titer}
CURR GEST AGE: 19.4 wk
Down Syndrome Scr Risk Est: 1:26200 {titer}
HCG, Total: 4.31 IU/mL
INH: 75.8 pg/mL
INTERPRETATION-AFP: NEGATIVE
MOM FOR HCG: 0.21
MoM for AFP: 0.72
MoM for INH: 0.5
OPEN SPINA BIFIDA: NEGATIVE
Osb Risk: <1:27300 {titer}
TRI 18 SCR RISK EST: NEGATIVE
Trisomy 18 (Edward) Syndrome Interp.: 1:999 {titer}
UE3 VALUE: 1.84 ng/mL
uE3 Mom: 1.22

## 2016-12-28 IMAGING — US US OB FOLLOW-UP
2 series · 12 of 28 positions shown · non-contrast
Comparison: none

[Series 1: us ob follow up · 9 of 61 slices shown (1 of 2)]
[im 4/61]
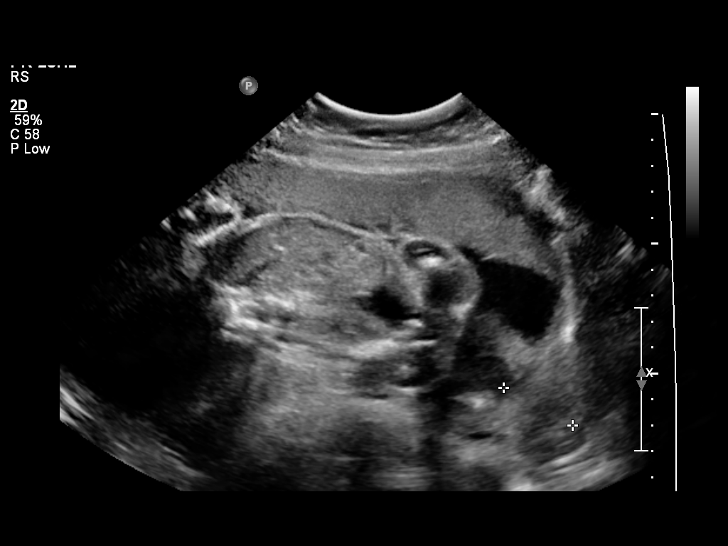
[im 10/61]
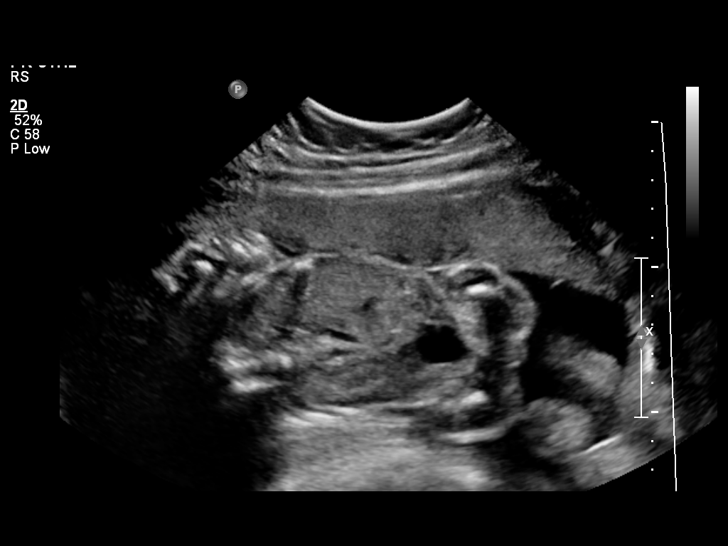
[im 16/61]
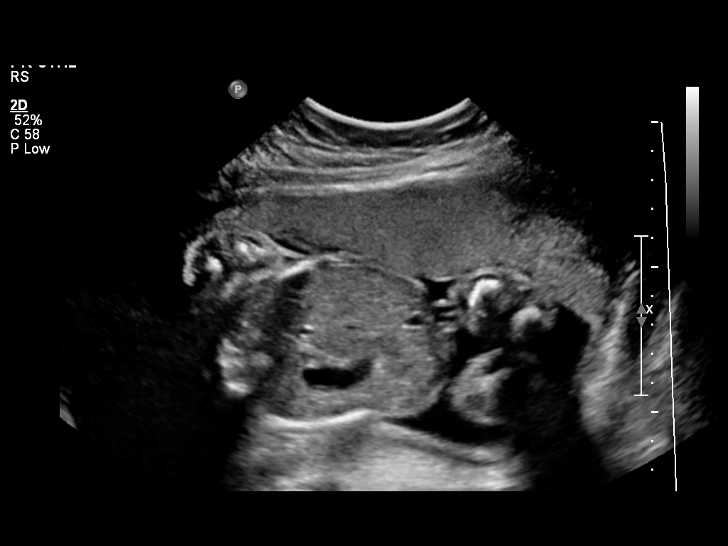
[im 25/61]
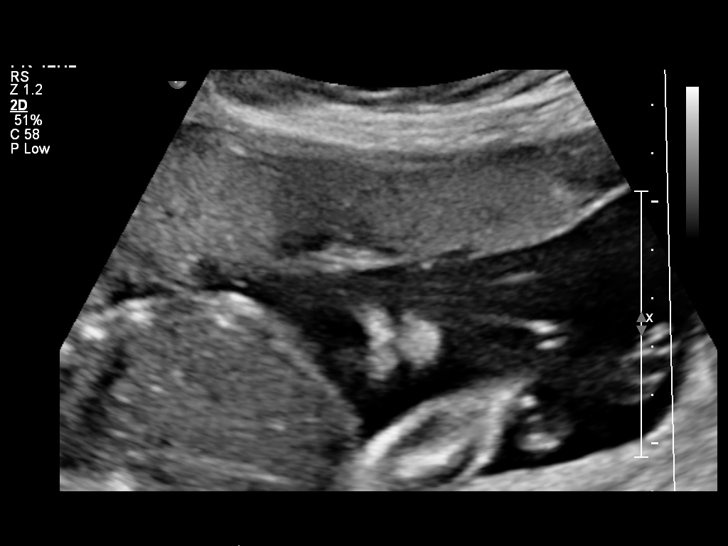
[im 31/61]
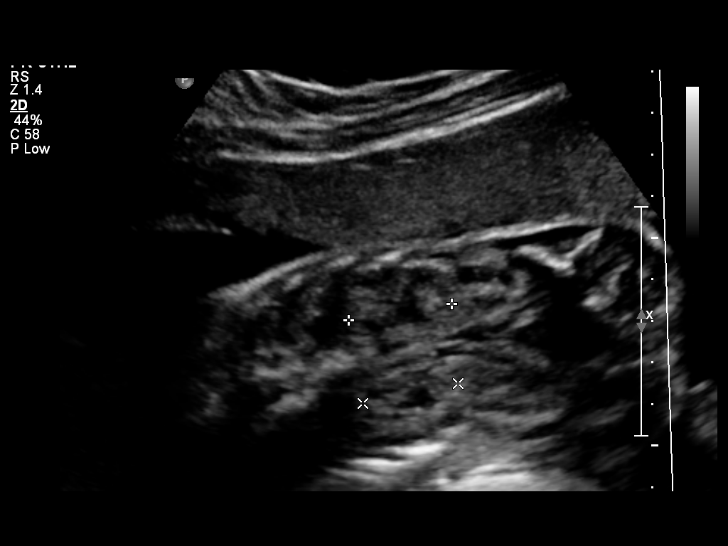
[im 37/61]
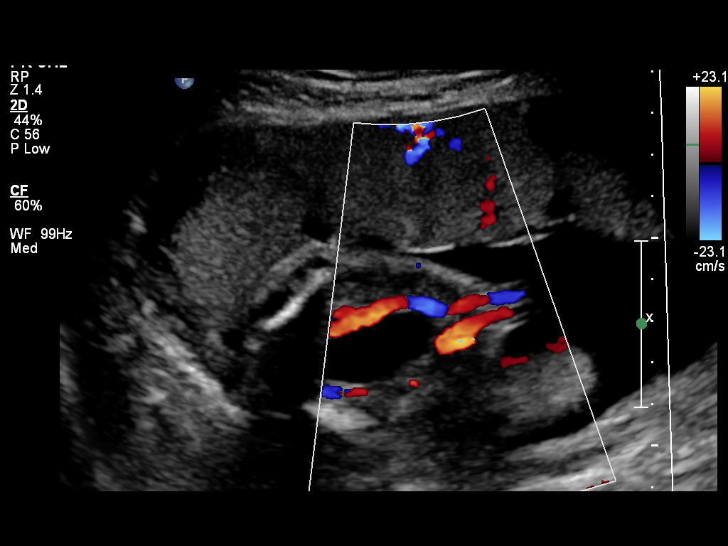
[im 46/61]
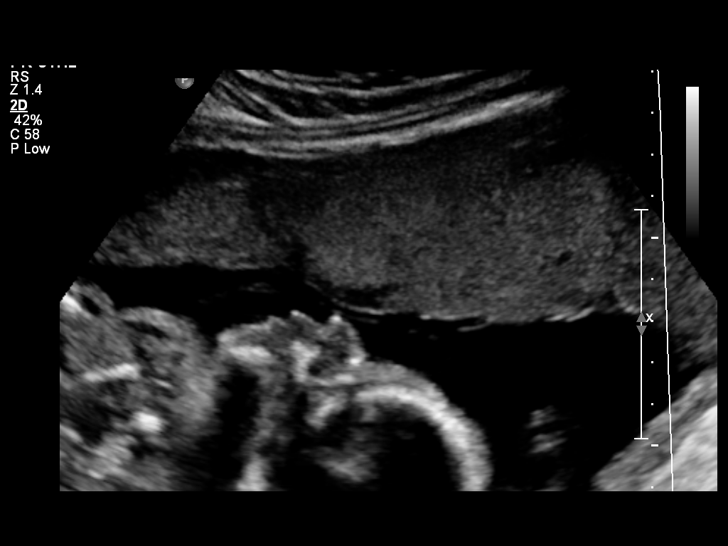
[im 52/61]
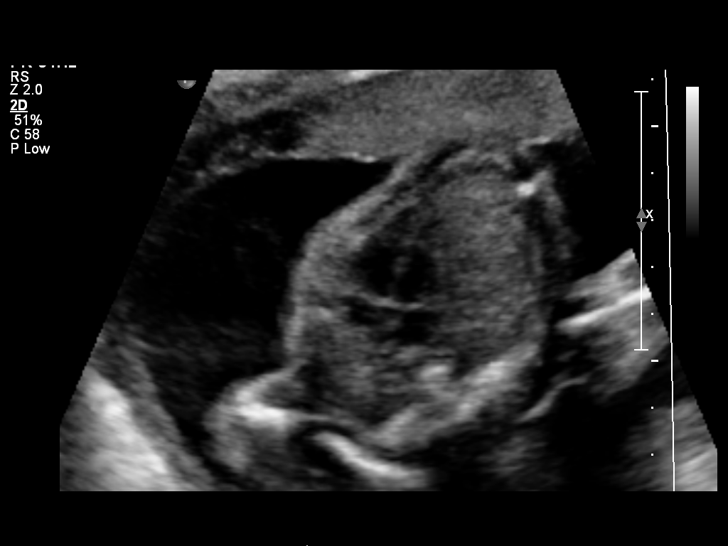
[im 58/61]
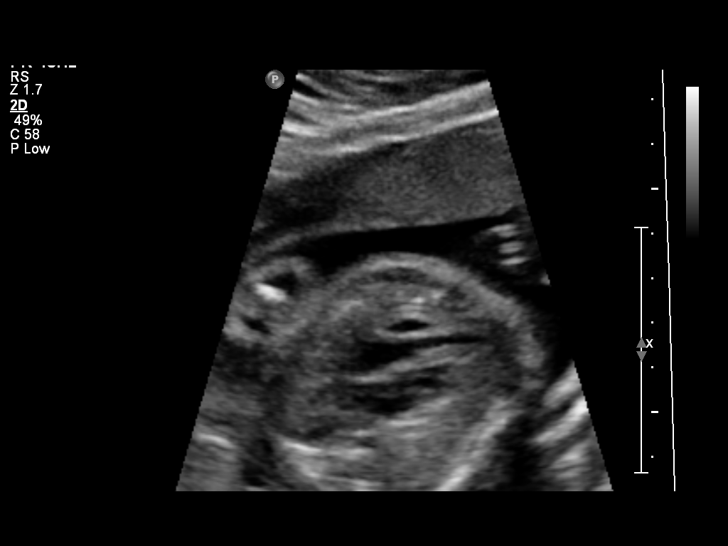

[Series 1: us ob follow up · 3 of 21 slices shown (2 of 2)]
[im 4/21]
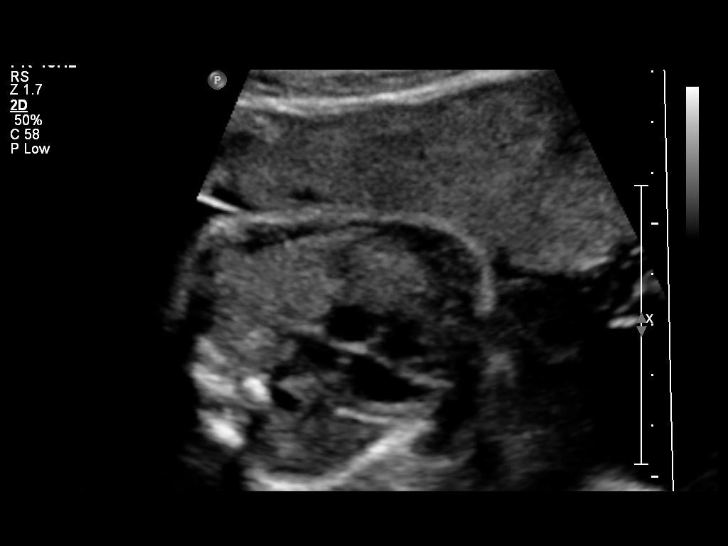
[im 11/21]
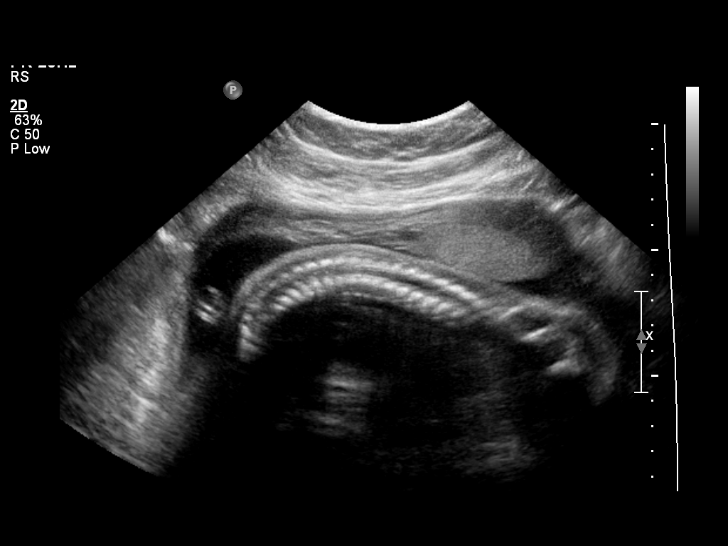
[im 17/21]
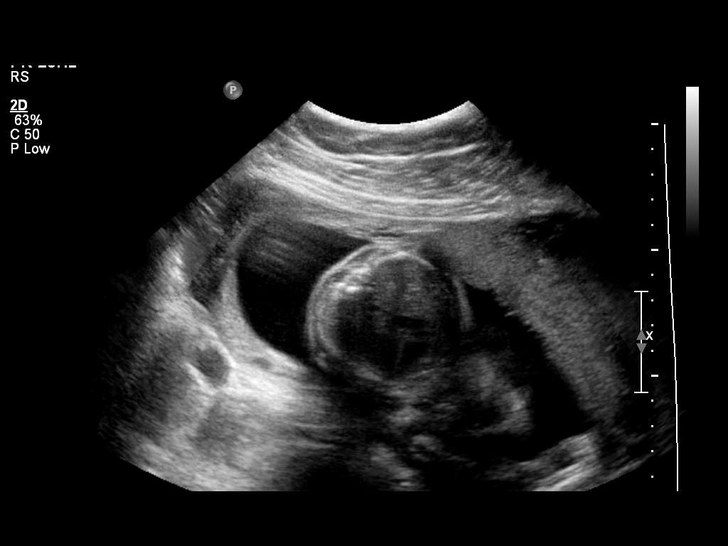

[12 of 28 positions shown; findings below may reference images not displayed]

OBSTETRICS REPORT
                      (Signed Final 12/17/2014 [DATE])

Service(s) Provided

 US OB FOLLOW UP                                       76816.1
Indications

 25 weeks gestation of pregnancy
 Follow-up incomplete fetal anatomic evaluation        Z36
 Uterine abnormality during pregnancy; septum
 resection [DATE]
 Placenta previa specified as without hemhorrhage,
 second trimester
Fetal Evaluation

 Num Of Fetuses:    1
 Fetal Heart Rate:  138                          bpm
 Cardiac Activity:  Observed
 Presentation:      Breech
 Placenta:          Anterior, above cervical os
 P. Cord            Visualized
 Insertion:

 Amniotic Fluid
 AFI FV:      Subjectively within normal limits
                                             Larg Pckt:     6.2  cm
Biometry

 BPD:     66.2  mm     G. Age:  26w 5d                CI:        74.95   70 - 86
                                                      FL/HC:      19.2   18.7 -

 HC:     242.6  mm     G. Age:  26w 3d       65  %    HC/AC:      1.09   1.04 -

 AC:     223.5  mm     G. Age:  26w 5d       83  %    FL/BPD:     70.2   71 - 87
 FL:      46.5  mm     G. Age:  25w 4d       42  %    FL/AC:      20.8   20 - 24
 HUM:     44.7  mm     G. Age:  26w 4d       74  %

 Est. FW:     913  gm           2 lb     73  %
Gestational Age

 LMP:           25w 2d        Date:  06/23/14                 EDD:   03/30/15
 U/S Today:     26w 2d                                        EDD:   03/23/15
 Best:          25w 2d     Det. By:  LMP  (06/23/14)          EDD:   03/30/15
Anatomy
 Cranium:          Appears normal         Aortic Arch:      Previously seen
 Fetal Cavum:      Previously seen        Ductal Arch:      Previously seen
 Ventricles:       Appears normal         Diaphragm:        Appears normal
 Choroid Plexus:   Previously seen        Stomach:          Appears normal, left
                                                            sided
 Cerebellum:       Previously seen        Abdomen:          Appears normal
 Posterior Fossa:  Previously seen        Abdominal Wall:   Appears nml (cord
                                                            insert, abd wall)
 Nuchal Fold:      Previously seen        Cord Vessels:     Appears normal (3
                                                            vessel cord)
 Face:             Profile appears        Kidneys:          Appear normal
                   normal
 Lips:             Appears normal         Bladder:          Appears normal
 Heart:            Appears normal         Spine:            Appears normal
                   (4CH, axis, and
                   situs)
 RVOT:             Appears normal         Lower             Previously seen
                                          Extremities:
 LVOT:             Appears normal         Upper             Previously seen
                                          Extremities:

 Other:  Male gender. Heels prev. visualized. Technically difficult due to
         maternal habitus and fetal position.
Cervix Uterus Adnexa

 Cervix:       Normal appearance by transabdominal scan.

 Left Ovary:    Size(cm) L: 3.21 x W: 3.61 x H: 2.34  Volume(cc):

 Right Ovary:   Not visualized. No adnexal mass visualized.
Impression

 SIUP at 25+2 weeks
 Normal interval anatomy; anatomic survey complete
 Normal amniotic fluid volume
 Appropriate interval growth with EFW at the 73rd %tile
 Anterior placenta; no previa
Recommendations

 Follow-up as clinically indicated

 questions or concerns.

## 2017-01-09 ENCOUNTER — Ambulatory Visit (HOSPITAL_COMMUNITY)
Admission: RE | Admit: 2017-01-09 | Discharge: 2017-01-09 | Disposition: A | Discharge status: Home / Self Care | Payer: BLUE CROSS/BLUE SHIELD | Source: Ambulatory Visit | Attending: Advanced Practice Midwife | Admitting: Advanced Practice Midwife

## 2017-01-09 DIAGNOSIS — Z6832 Body mass index (BMI) 32.0-32.9, adult: Secondary | ICD-10-CM | POA: Diagnosis not present

## 2017-01-09 DIAGNOSIS — IMO0002 Reserved for concepts with insufficient information to code with codable children: Secondary | ICD-10-CM

## 2017-01-09 DIAGNOSIS — Z3A24 24 weeks gestation of pregnancy: Secondary | ICD-10-CM | POA: Diagnosis not present

## 2017-01-09 DIAGNOSIS — O3402 Maternal care for unspecified congenital malformation of uterus, second trimester: Secondary | ICD-10-CM | POA: Insufficient documentation

## 2017-01-09 DIAGNOSIS — O34219 Maternal care for unspecified type scar from previous cesarean delivery: Secondary | ICD-10-CM | POA: Diagnosis not present

## 2017-01-09 DIAGNOSIS — Z0489 Encounter for examination and observation for other specified reasons: Secondary | ICD-10-CM

## 2017-01-09 DIAGNOSIS — Q512 Other doubling of uterus, unspecified: Secondary | ICD-10-CM

## 2017-01-09 DIAGNOSIS — O99212 Obesity complicating pregnancy, second trimester: Secondary | ICD-10-CM | POA: Diagnosis not present

## 2017-01-09 DIAGNOSIS — Z362 Encounter for other antenatal screening follow-up: Secondary | ICD-10-CM | POA: Diagnosis not present

## 2017-01-09 DIAGNOSIS — O09892 Supervision of other high risk pregnancies, second trimester: Secondary | ICD-10-CM | POA: Diagnosis not present

## 2017-01-09 DIAGNOSIS — Z141 Cystic fibrosis carrier: Secondary | ICD-10-CM | POA: Insufficient documentation

## 2017-01-09 DIAGNOSIS — E669 Obesity, unspecified: Secondary | ICD-10-CM | POA: Diagnosis not present

## 2017-01-09 DIAGNOSIS — Z3492 Encounter for supervision of normal pregnancy, unspecified, second trimester: Secondary | ICD-10-CM

## 2017-02-01 ENCOUNTER — Ambulatory Visit (INDEPENDENT_AMBULATORY_CARE_PROVIDER_SITE_OTHER): Payer: BLUE CROSS/BLUE SHIELD | Admitting: Advanced Practice Midwife

## 2017-02-01 VITALS — BP 111/72 | HR 92 | Wt 192.0 lb

## 2017-02-01 DIAGNOSIS — Z23 Encounter for immunization: Secondary | ICD-10-CM | POA: Diagnosis not present

## 2017-02-01 DIAGNOSIS — Z3482 Encounter for supervision of other normal pregnancy, second trimester: Secondary | ICD-10-CM

## 2017-02-01 LAB — CBC
HCT: 34 % — ABNORMAL LOW (ref 35.0–45.0)
Hemoglobin: 11.2 g/dL — ABNORMAL LOW (ref 11.7–15.5)
MCH: 28 pg (ref 27.0–33.0)
MCHC: 32.9 g/dL (ref 32.0–36.0)
MCV: 85 fL (ref 80.0–100.0)
MPV: 9 fL (ref 7.5–12.5)
PLATELETS: 280 10*3/uL (ref 140–400)
RBC: 4 MIL/uL (ref 3.80–5.10)
RDW: 14.2 % (ref 11.0–15.0)
WBC: 8.6 10*3/uL (ref 3.8–10.8)

## 2017-02-01 NOTE — Patient Instructions (Signed)
Third Trimester of Pregnancy The third trimester is from week 28 through week 40 (months 7 through 9). The third trimester is a time when the unborn baby (fetus) is growing rapidly. At the end of the ninth month, the fetus is about 20 inches in length and weighs 6-10 pounds. Body changes during your third trimester Your body will continue to go through many changes during pregnancy. The changes vary from woman to woman. During the third trimester:  Your weight will continue to increase. You can expect to gain 25-35 pounds (11-16 kg) by the end of the pregnancy.  You may begin to get stretch marks on your hips, abdomen, and breasts.  You may urinate more often because the fetus is moving lower into your pelvis and pressing on your bladder.  You may develop or continue to have heartburn. This is caused by increased hormones that slow down muscles in the digestive tract.  You may develop or continue to have constipation because increased hormones slow digestion and cause the muscles that push waste through your intestines to relax.  You may develop hemorrhoids. These are swollen veins (varicose veins) in the rectum that can itch or be painful.  You may develop swollen, bulging veins (varicose veins) in your legs.  You may have increased body aches in the pelvis, back, or thighs. This is due to weight gain and increased hormones that are relaxing your joints.  You may have changes in your hair. These can include thickening of your hair, rapid growth, and changes in texture. Some women also have hair loss during or after pregnancy, or hair that feels dry or thin. Your hair will most likely return to normal after your baby is born.  Your breasts will continue to grow and they will continue to become tender. A yellow fluid (colostrum) may leak from your breasts. This is the first milk you are producing for your baby.  Your belly button may stick out.  You may notice more swelling in your hands,  face, or ankles.  You may have increased tingling or numbness in your hands, arms, and legs. The skin on your belly may also feel numb.  You may feel short of breath because of your expanding uterus.  You may have more problems sleeping. This can be caused by the size of your belly, increased need to urinate, and an increase in your body's metabolism.  You may notice the fetus "dropping," or moving lower in your abdomen (lightening).  You may have increased vaginal discharge.  You may notice your joints feel loose and you may have pain around your pelvic bone.  What to expect at prenatal visits You will have prenatal exams every 2 weeks until week 36. Then you will have weekly prenatal exams. During a routine prenatal visit:  You will be weighed to make sure you and the baby are growing normally.  Your blood pressure will be taken.  Your abdomen will be measured to track your baby's growth.  The fetal heartbeat will be listened to.  Any test results from the previous visit will be discussed.  You may have a cervical check near your due date to see if your cervix has softened or thinned (effaced).  You will be tested for Group B streptococcus. This happens between 35 and 37 weeks.  Your health care provider may ask you:  What your birth plan is.  How you are feeling.  If you are feeling the baby move.  If you have had   any abnormal symptoms, such as leaking fluid, bleeding, severe headaches, or abdominal cramping.  If you are using any tobacco products, including cigarettes, chewing tobacco, and electronic cigarettes.  If you have any questions.  Other tests or screenings that may be performed during your third trimester include:  Blood tests that check for low iron levels (anemia).  Fetal testing to check the health, activity level, and growth of the fetus. Testing is done if you have certain medical conditions or if there are problems during the  pregnancy.  Nonstress test (NST). This test checks the health of your baby to make sure there are no signs of problems, such as the baby not getting enough oxygen. During this test, a belt is placed around your belly. The baby is made to move, and its heart rate is monitored during movement.  What is false labor? False labor is a condition in which you feel small, irregular tightenings of the muscles in the womb (contractions) that usually go away with rest, changing position, or drinking water. These are called Braxton Hicks contractions. Contractions may last for hours, days, or even weeks before true labor sets in. If contractions come at regular intervals, become more frequent, increase in intensity, or become painful, you should see your health care provider. What are the signs of labor?  Abdominal cramps.  Regular contractions that start at 10 minutes apart and become stronger and more frequent with time.  Contractions that start on the top of the uterus and spread down to the lower abdomen and back.  Increased pelvic pressure and dull back pain.  A watery or bloody mucus discharge that comes from the vagina.  Leaking of amniotic fluid. This is also known as your "water breaking." It could be a slow trickle or a gush. Let your health care provider know if it has a color or strange odor. If you have any of these signs, call your health care provider right away, even if it is before your due date. Follow these instructions at home: Medicines  Follow your health care provider's instructions regarding medicine use. Specific medicines may be either safe or unsafe to take during pregnancy.  Take a prenatal vitamin that contains at least 600 micrograms (mcg) of folic acid.  If you develop constipation, try taking a stool softener if your health care provider approves. Eating and drinking  Eat a balanced diet that includes fresh fruits and vegetables, whole grains, good sources of protein  such as meat, eggs, or tofu, and low-fat dairy. Your health care provider will help you determine the amount of weight gain that is right for you.  Avoid raw meat and uncooked cheese. These carry germs that can cause birth defects in the baby.  If you have low calcium intake from food, talk to your health care provider about whether you should take a daily calcium supplement.  Eat four or five small meals rather than three large meals a day.  Limit foods that are high in fat and processed sugars, such as fried and sweet foods.  To prevent constipation: ? Drink enough fluid to keep your urine clear or pale yellow. ? Eat foods that are high in fiber, such as fresh fruits and vegetables, whole grains, and beans. Activity  Exercise only as directed by your health care provider. Most women can continue their usual exercise routine during pregnancy. Try to exercise for 30 minutes at least 5 days a week. Stop exercising if you experience uterine contractions.  Avoid heavy   lifting.  Do not exercise in extreme heat or humidity, or at high altitudes.  Wear low-heel, comfortable shoes.  Practice good posture.  You may continue to have sex unless your health care provider tells you otherwise. Relieving pain and discomfort  Take frequent breaks and rest with your legs elevated if you have leg cramps or low back pain.  Take warm sitz baths to soothe any pain or discomfort caused by hemorrhoids. Use hemorrhoid cream if your health care provider approves.  Wear a good support bra to prevent discomfort from breast tenderness.  If you develop varicose veins: ? Wear support pantyhose or compression stockings as told by your healthcare provider. ? Elevate your feet for 15 minutes, 3-4 times a day. Prenatal care  Write down your questions. Take them to your prenatal visits.  Keep all your prenatal visits as told by your health care provider. This is important. Safety  Wear your seat belt at  all times when driving.  Make a list of emergency phone numbers, including numbers for family, friends, the hospital, and police and fire departments. General instructions  Avoid cat litter boxes and soil used by cats. These carry germs that can cause birth defects in the baby. If you have a cat, ask someone to clean the litter box for you.  Do not travel far distances unless it is absolutely necessary and only with the approval of your health care provider.  Do not use hot tubs, steam rooms, or saunas.  Do not drink alcohol.  Do not use any products that contain nicotine or tobacco, such as cigarettes and e-cigarettes. If you need help quitting, ask your health care provider.  Do not use any medicinal herbs or unprescribed drugs. These chemicals affect the formation and growth of the baby.  Do not douche or use tampons or scented sanitary pads.  Do not cross your legs for long periods of time.  To prepare for the arrival of your baby: ? Take prenatal classes to understand, practice, and ask questions about labor and delivery. ? Make a trial run to the hospital. ? Visit the hospital and tour the maternity area. ? Arrange for maternity or paternity leave through employers. ? Arrange for family and friends to take care of pets while you are in the hospital. ? Purchase a rear-facing car seat and make sure you know how to install it in your car. ? Pack your hospital bag. ? Prepare the baby's nursery. Make sure to remove all pillows and stuffed animals from the baby's crib to prevent suffocation.  Visit your dentist if you have not gone during your pregnancy. Use a soft toothbrush to brush your teeth and be gentle when you floss. Contact a health care provider if:  You are unsure if you are in labor or if your water has broken.  You become dizzy.  You have mild pelvic cramps, pelvic pressure, or nagging pain in your abdominal area.  You have lower back pain.  You have persistent  nausea, vomiting, or diarrhea.  You have an unusual or bad smelling vaginal discharge.  You have pain when you urinate. Get help right away if:  Your water breaks before 37 weeks.  You have regular contractions less than 5 minutes apart before 37 weeks.  You have a fever.  You are leaking fluid from your vagina.  You have spotting or bleeding from your vagina.  You have severe abdominal pain or cramping.  You have rapid weight loss or weight gain.    You have shortness of breath with chest pain.  You notice sudden or extreme swelling of your face, hands, ankles, feet, or legs.  Your baby makes fewer than 10 movements in 2 hours.  You have severe headaches that do not go away when you take medicine.  You have vision changes. Summary  The third trimester is from week 28 through week 40, months 7 through 9. The third trimester is a time when the unborn baby (fetus) is growing rapidly.  During the third trimester, your discomfort may increase as you and your baby continue to gain weight. You may have abdominal, leg, and back pain, sleeping problems, and an increased need to urinate.  During the third trimester your breasts will keep growing and they will continue to become tender. A yellow fluid (colostrum) may leak from your breasts. This is the first milk you are producing for your baby.  False labor is a condition in which you feel small, irregular tightenings of the muscles in the womb (contractions) that eventually go away. These are called Braxton Hicks contractions. Contractions may last for hours, days, or even weeks before true labor sets in.  Signs of labor can include: abdominal cramps; regular contractions that start at 10 minutes apart and become stronger and more frequent with time; watery or bloody mucus discharge that comes from the vagina; increased pelvic pressure and dull back pain; and leaking of amniotic fluid. This information is not intended to replace advice  given to you by your health care provider. Make sure you discuss any questions you have with your health care provider. Document Released: 09/11/2001 Document Revised: 02/23/2016 Document Reviewed: 11/18/2012 Elsevier Interactive Patient Education  2017 [employer]  

## 2017-02-01 NOTE — Progress Notes (Signed)
   PRENATAL VISIT NOTE  Subjective:  Stacey Keith is a 31 y.o. G3P1011 at 9570w3d being seen today for ongoing prenatal care.  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  Patient reports no complaints.  Contractions: Not present. Vag. Bleeding: None.  Movement: Present. Denies leaking of fluid.   The following portions of the patient's history were reviewed and updated as appropriate: allergies, current medications, past family history, past medical history, past social history, past surgical history and problem list. Problem list updated.  Objective:   Vitals:   02/01/17 0853  BP: 111/72  Pulse: 92  Weight: 192 lb (87.1 kg)    Fetal Status: Fetal Heart Rate (bpm): 139   Movement: Present     General:  Alert, oriented and cooperative. Patient is in no acute distress.  Skin: Skin is warm and dry. No rash noted.   Cardiovascular: Normal heart rate noted  Respiratory: Normal respiratory effort, no problems with respiration noted  Abdomen: Soft, gravid, appropriate for gestational age. Pain/Pressure: Absent     Pelvic:  Cervical exam deferred        Extremities: Normal range of motion.  Edema: Trace  Mental Status: Normal mood and affect. Normal behavior. Normal judgment and thought content.   Assessment and Plan:  Pregnancy: G3P1011 at 2970w3d  1. Encounter for supervision of other normal pregnancy in second trimester  - Glucose Tolerance, 2 Hours w/1 Hour - CBC - HIV antibody (with reflex) - RPR - Tdap vaccine greater than or equal to 7yo IM  Preterm labor symptoms and general obstetric precautions including but not limited to vaginal bleeding, contractions, leaking of fluid and fetal movement were  reviewed in detail with the patient. Please refer to After Visit Summary for other counseling recommendations.  RTO 2 weeks  Aviva SignsMarie L Thais Keith, CNM

## 2017-02-02 LAB — GLUCOSE TOLERANCE, 2 HOURS W/ 1HR
GLUCOSE, 2 HOUR: 118 mg/dL (ref <140)
GLUCOSE: 109 mg/dL
Glucose, Fasting: 75 mg/dL (ref 65–99)

## 2017-02-02 LAB — RPR

## 2017-02-02 LAB — HIV ANTIBODY (ROUTINE TESTING W REFLEX): HIV: NONREACTIVE

## 2017-03-05 ENCOUNTER — Ambulatory Visit (INDEPENDENT_AMBULATORY_CARE_PROVIDER_SITE_OTHER): Payer: BLUE CROSS/BLUE SHIELD | Admitting: Obstetrics & Gynecology

## 2017-03-05 VITALS — BP 117/72 | HR 102 | Wt 197.0 lb

## 2017-03-05 DIAGNOSIS — O99213 Obesity complicating pregnancy, third trimester: Secondary | ICD-10-CM

## 2017-03-05 DIAGNOSIS — Q5128 Other doubling of uterus, other specified: Secondary | ICD-10-CM

## 2017-03-05 DIAGNOSIS — Q512 Other doubling of uterus, unspecified: Secondary | ICD-10-CM

## 2017-03-05 DIAGNOSIS — O34219 Maternal care for unspecified type scar from previous cesarean delivery: Secondary | ICD-10-CM

## 2017-03-05 DIAGNOSIS — O99210 Obesity complicating pregnancy, unspecified trimester: Secondary | ICD-10-CM

## 2017-03-05 DIAGNOSIS — Z3480 Encounter for supervision of other normal pregnancy, unspecified trimester: Secondary | ICD-10-CM

## 2017-03-05 DIAGNOSIS — Z3483 Encounter for supervision of other normal pregnancy, third trimester: Secondary | ICD-10-CM

## 2017-03-05 NOTE — Progress Notes (Signed)
   PRENATAL VISIT NOTE  Subjective:  Stacey Keith is a 31 y.o. G3P1011 at 324w0d being seen today for ongoing prenatal care.  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  Patient reports no complaints.  Contractions: Irritability. Vag. Bleeding: None.  Movement: Present. Denies leaking of fluid.   The following portions of the patient's history were reviewed and updated as appropriate: allergies, current medications, past family history, past medical history, past social history, past surgical history and problem list. Problem list updated.  Objective:   Vitals:   03/05/17 1510  BP: 117/72  Pulse: (!) 102  Weight: 197 lb (89.4 kg)    Fetal Status: Fetal Heart Rate (bpm): 143   Movement: Present     General:  Alert, oriented and cooperative. Patient is in no acute distress.  Skin: Skin is warm and dry. No rash noted.   Cardiovascular: Normal heart rate noted  Respiratory: Normal respiratory effort, no problems with respiration noted  Abdomen: Soft, gravid, appropriate for gestational age. Pain/Pressure: Absent     Pelvic:  Cervical exam deferred        Extremities: Normal range of motion.  Edema: Trace  Mental Status: Normal mood and affect. Normal behavior. Normal judgment and thought content.   Assessment and Plan:  Pregnancy: G3P1011 at 7324w0d  1. Supervision of other normal pregnancy, antepartum   2. Septate uterus   3. Previous cesarean delivery, delivered - She would like a RLTCS and BTL  4. Obesity in pregnancy   Preterm labor symptoms and general obstetric precautions including but not limited to vaginal bleeding, contractions, leaking of fluid and fetal movement were reviewed in detail with  the patient. Please refer to After Visit Summary for other counseling recommendations.  No Follow-up on file.   Allie BossierMyra C Dellia Donnelly, MD

## 2017-03-08 ENCOUNTER — Encounter: Payer: BLUE CROSS/BLUE SHIELD | Admitting: Advanced Practice Midwife

## 2017-03-26 ENCOUNTER — Ambulatory Visit (INDEPENDENT_AMBULATORY_CARE_PROVIDER_SITE_OTHER): Payer: BLUE CROSS/BLUE SHIELD | Admitting: Obstetrics & Gynecology

## 2017-03-26 VITALS — BP 124/81

## 2017-03-26 DIAGNOSIS — O368130 Decreased fetal movements, third trimester, not applicable or unspecified: Secondary | ICD-10-CM | POA: Diagnosis not present

## 2017-03-26 NOTE — Progress Notes (Signed)
Pt called stating that she has been at work this morning and now home and hasn't felt any movement.  She states that she has eaten lunch and usually baby is active afterwards.  Pt here to be monitored on NST.  On way here pt states that baby has had hiccups and is feeling some movement now.  Baby is reactive on NST.  She denies any leaking of fluid or any other concerns.  Instructions on kick counts given to pt.  She will keep her appt on 04/04/17 for ROB or will call if needed before.

## 2017-04-04 ENCOUNTER — Other Ambulatory Visit (HOSPITAL_COMMUNITY)
Admission: RE | Admit: 2017-04-04 | Discharge: 2017-04-04 | Disposition: A | Discharge status: Home / Self Care | Payer: BLUE CROSS/BLUE SHIELD | Source: Ambulatory Visit | Attending: Obstetrics & Gynecology | Admitting: Obstetrics & Gynecology

## 2017-04-04 ENCOUNTER — Ambulatory Visit (INDEPENDENT_AMBULATORY_CARE_PROVIDER_SITE_OTHER): Payer: BLUE CROSS/BLUE SHIELD | Admitting: Obstetrics & Gynecology

## 2017-04-04 VITALS — BP 126/80 | HR 97 | Wt 204.0 lb

## 2017-04-04 DIAGNOSIS — Z3493 Encounter for supervision of normal pregnancy, unspecified, third trimester: Secondary | ICD-10-CM | POA: Insufficient documentation

## 2017-04-04 DIAGNOSIS — Z113 Encounter for screening for infections with a predominantly sexual mode of transmission: Secondary | ICD-10-CM | POA: Diagnosis not present

## 2017-04-04 NOTE — Progress Notes (Signed)
   PRENATAL VISIT NOTE  Subjective:  Stacey Keith is a 31 y.o. G3P1011 at 4522w2d being seen today for ongoing prenatal care.  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  Patient reports no complaints.  Contractions: Irritability. Vag. Bleeding: None.  Movement: Present. Denies leaking of fluid.   The following portions of the patient's history were reviewed and updated as appropriate: allergies, current medications, past family history, past medical history, past social history, past surgical history and problem list. Problem list updated.  Objective:   Vitals:   04/04/17 1601  BP: 126/80  Pulse: 97  Weight: 204 lb (92.5 kg)    Fetal Status:     Movement: Present     General:  Alert, oriented and cooperative. Patient is in no acute distress.  Skin: Skin is warm and dry. No rash noted.   Cardiovascular: Normal heart rate noted  Respiratory: Normal respiratory effort, no problems with respiration noted  Abdomen: Soft, gravid, appropriate for gestational age. Pain/Pressure: Present     Pelvic:  Cervical exam deferred        Extremities: Normal range of motion.  Edema: Trace  Mental Status: Normal mood and affect. Normal behavior. Normal judgment and thought content.   Assessment and Plan:  Pregnancy: G3P1011 at 3422w2d  1. Encounter for supervision of normal pregnancy in third trimester, unspecified gravidity  - Urine cytology ancillary only - Culture, beta strep (group b only)  Preterm labor symptoms and general obstetric precautions including but not limited to vaginal bleeding, contractions, leaking of fluid and fetal movement were reviewed in detail with the patient. Please refer to After Visit Summary  for other counseling recommendations.  No Follow-up on file.   Allie BossierMyra C Iviona Hole, MD

## 2017-04-08 ENCOUNTER — Encounter: Payer: Self-pay | Admitting: *Deleted

## 2017-04-09 LAB — CULTURE, BETA STREP (GROUP B ONLY)

## 2017-04-09 LAB — URINE CYTOLOGY ANCILLARY ONLY
CHLAMYDIA, DNA PROBE: NEGATIVE
Neisseria Gonorrhea: NEGATIVE

## 2017-04-12 ENCOUNTER — Ambulatory Visit (INDEPENDENT_AMBULATORY_CARE_PROVIDER_SITE_OTHER): Payer: BLUE CROSS/BLUE SHIELD | Admitting: Advanced Practice Midwife

## 2017-04-12 ENCOUNTER — Other Ambulatory Visit: Payer: Self-pay | Admitting: Family Medicine

## 2017-04-12 DIAGNOSIS — Z3483 Encounter for supervision of other normal pregnancy, third trimester: Secondary | ICD-10-CM

## 2017-04-12 DIAGNOSIS — Z3480 Encounter for supervision of other normal pregnancy, unspecified trimester: Secondary | ICD-10-CM

## 2017-04-12 IMAGING — US US FETAL BPP W/O NONSTRESS
1 series · 14 of 27 positions shown · non-contrast
Comparison: none

[Series 1: us fetal bpp w/o nonstress · non-contrast · 27 acquisitions, 14 frames shown]
[im 1/27]
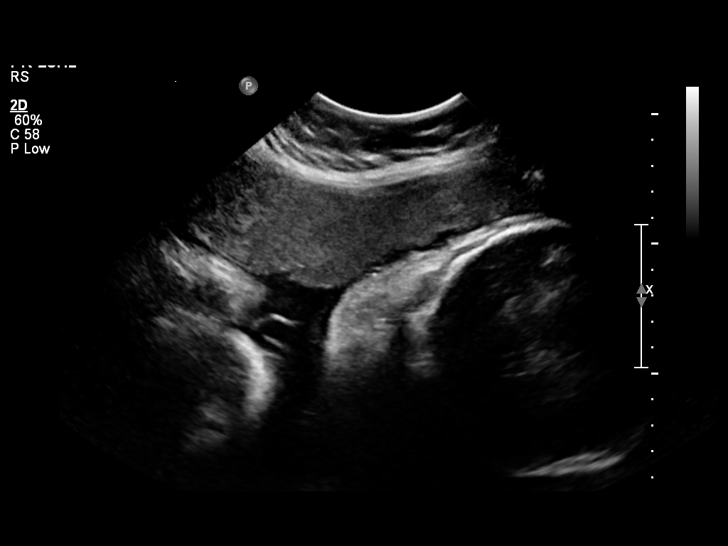
[im 3/27]
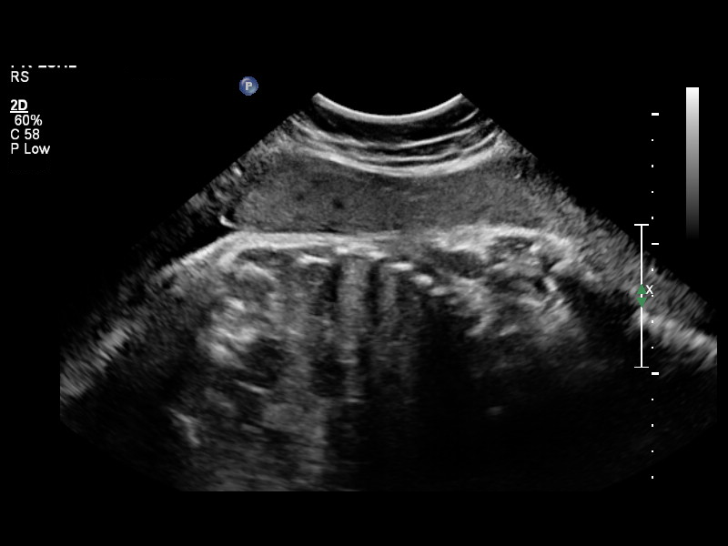
[im 5/27]
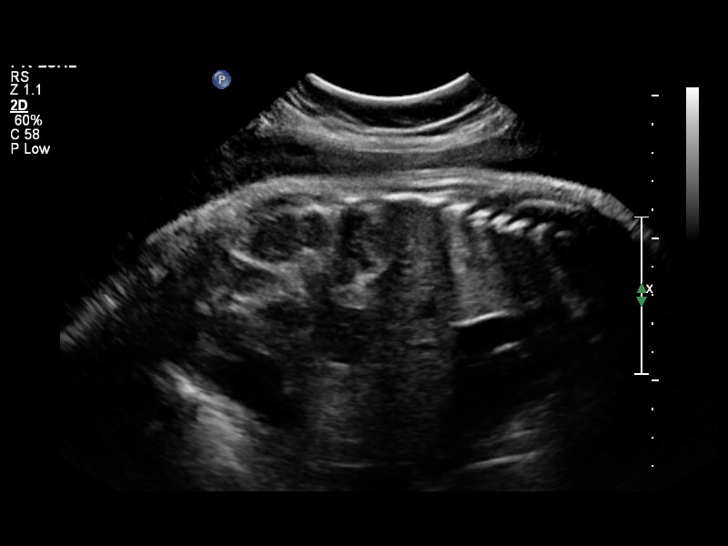
[im 7/27]
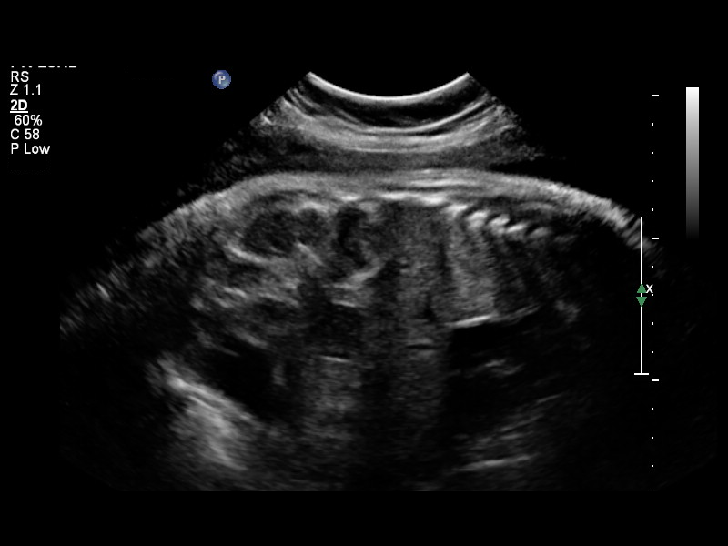
[im 9/27]
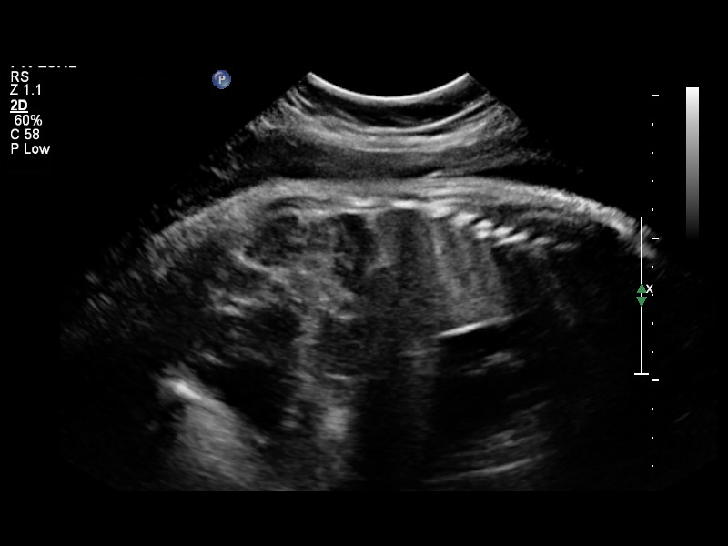
[im 11/27]
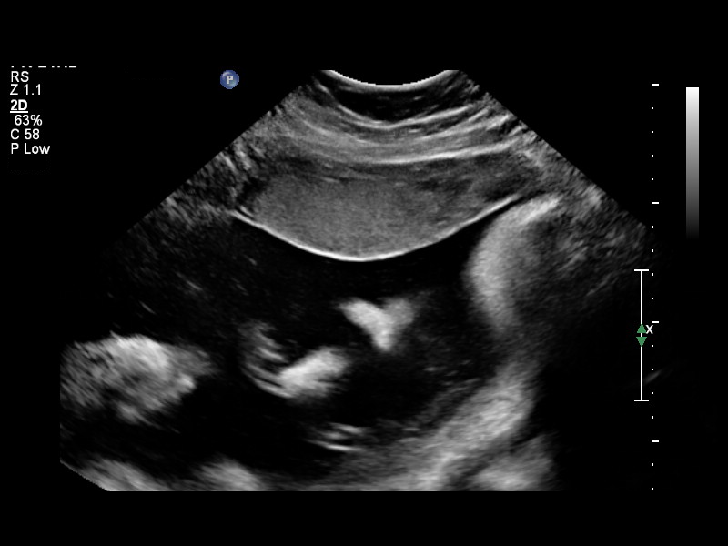
[im 13/27]
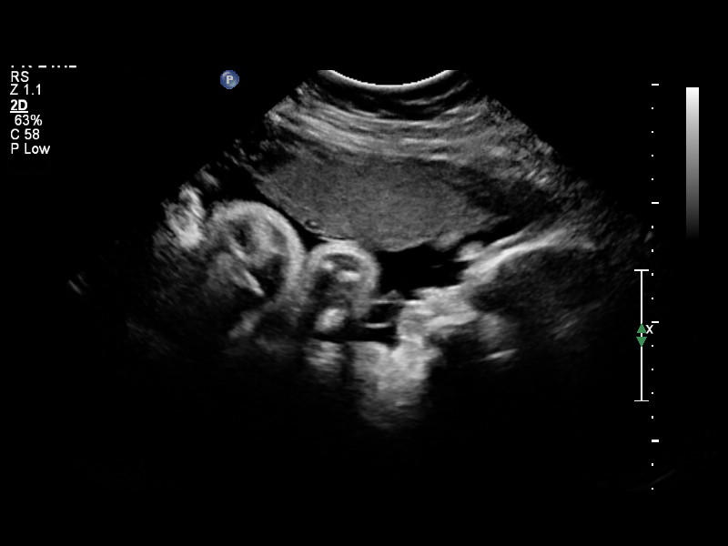
[im 15/27]
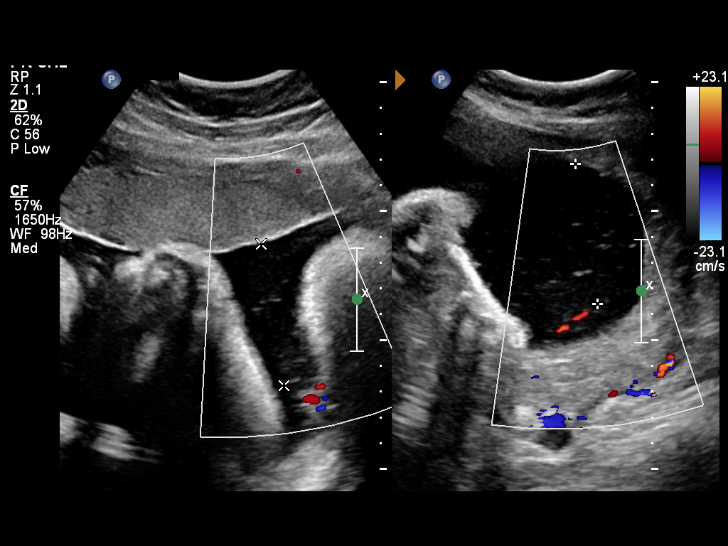
[im 17/27]
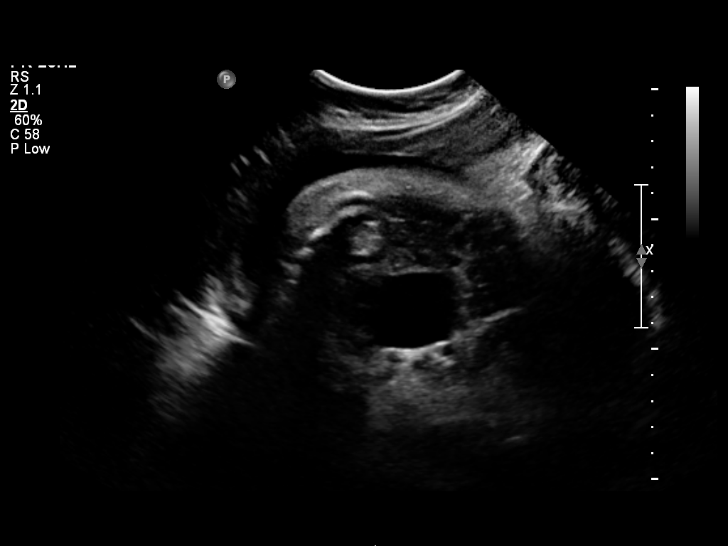
[im 19/27]
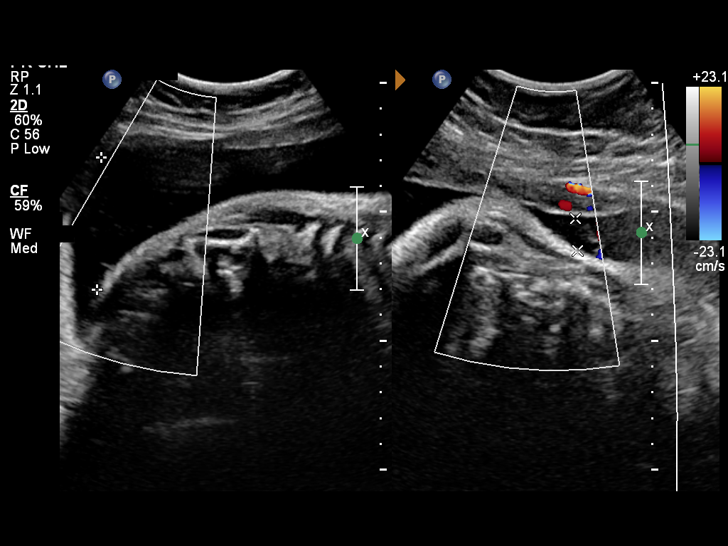
[im 21/27]
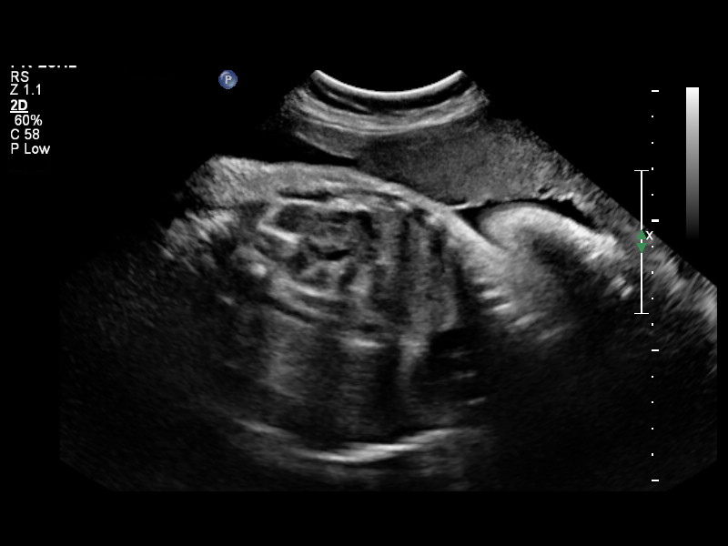
[im 23/27]
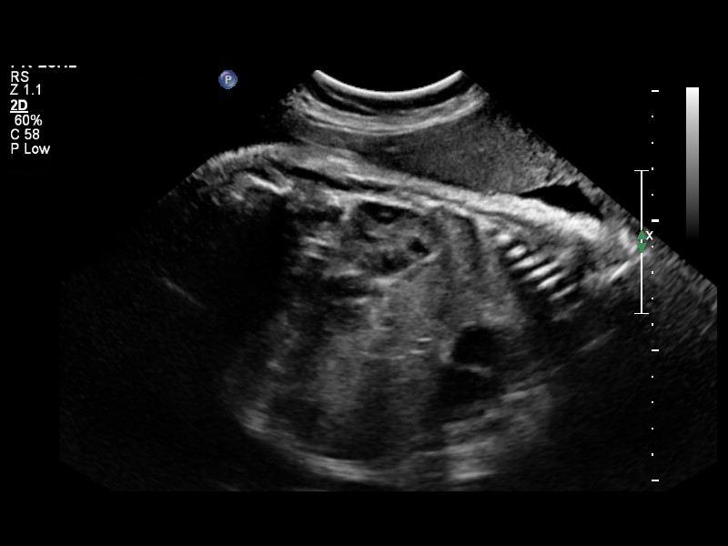
[im 25/27]
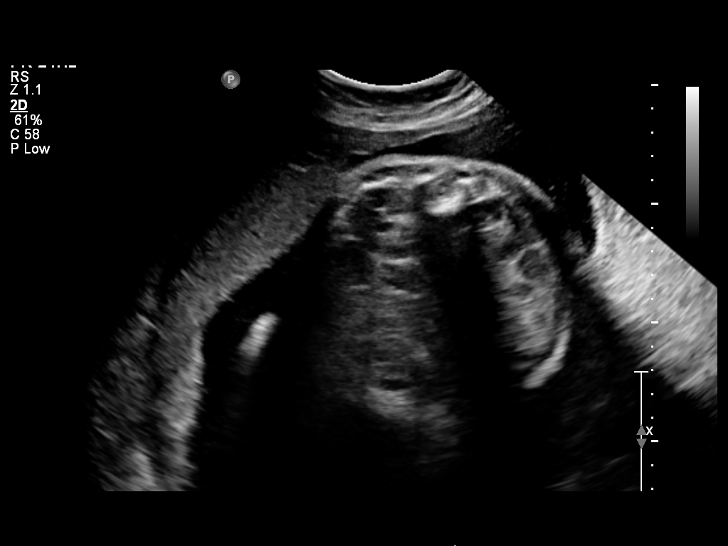
[im 27/27]
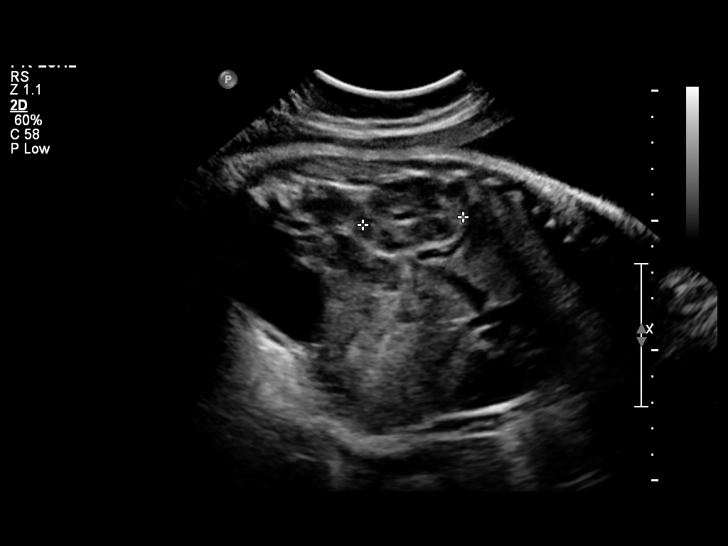

[14 of 27 positions shown; findings below may reference images not displayed]

OBSTETRICS REPORT
(Signed Final 04/01/2015 [DATE])

Service(s) Provided

Indications

40 weeks gestation of pregnancy
Post-term pregnancy
Fetal Evaluation

Num Of Fetuses:    1
Cardiac Activity:  Observed
Presentation:      Cephalic

Amniotic Fluid
AFI FV:      Subjectively within normal limits
AFI Sum:     14.93   cm       65  %Tile     Larg Pckt:    6.19  cm
RUQ:   6.19    cm   RLQ:    2.39   cm    LUQ:   5.11    cm   LLQ:    1.24   cm
Biophysical Evaluation

Amniotic F.V:   Within normal limits       F. Tone:        Observed
F. Movement:    Observed                   Score:          [DATE]
F. Breathing:   Observed
Gestational Age

LMP:           40w 2d        Date:  06/23/14                 EDD:   03/30/15
Best:          40w 2d     Det. By:  LMP  (06/23/14)          EDD:   03/30/15
Impression

Single IUP at 40w 2d
BPP [DATE]
Normal amniotic fluid volume
Recommendations

Follow-up ultrasounds as clinically indicated.

questions or concerns.

## 2017-04-12 NOTE — Progress Notes (Signed)
   PRENATAL VISIT NOTE  Subjective:  Stacey Keith is a 31 y.o. G3P1011 at 5113w3d being seen today for ongoing prenatal care.  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  Patient reports no complaints.  Contractions: Irritability. Vag. Bleeding: None.  Movement: Present. Denies leaking of fluid.   The following portions of the patient's history were reviewed and updated as appropriate: allergies, current medications, past family history, past medical history, past social history, past surgical history and problem list. Problem list updated.  Objective:   Vitals:   04/12/17 0852  BP: 120/84  Pulse: 89  Weight: 202 lb (91.6 kg)    Fetal Status: Fetal Heart Rate (bpm): 126 Fundal Height: 39 cm Movement: Present  Presentation: Vertex  General:  Alert, oriented and cooperative. Patient is in no acute distress.  Skin: Skin is warm and dry. No rash noted.   Cardiovascular: Normal heart rate noted  Respiratory: Normal respiratory effort, no problems with respiration noted  Abdomen: Soft, gravid, appropriate for gestational age. Pain/Pressure: Present     Pelvic:  Cervical exam performed Dilation: 1.5 Effacement (%): 50 Station: -3  Extremities: Normal range of motion.  Edema: Trace  Mental Status: Normal mood and affect. Normal behavior. Normal judgment and thought content.   GBS neg  Assessment and Plan:  Pregnancy: G3P1011 at 7413w3d  1. Supervision of other normal pregnancy, antepartum - Plans repeat C/S and BTL 04/23/17  Term labor symptoms and general obstetric precautions including but not limited to vaginal bleeding, contractions, leaking of fluid and fetal movement were reviewed in detail with the  patient. Please refer to After Visit Summary for other counseling recommendations.  Return in about 1 week (around 04/19/2017) for ROB.   Stacey Keith, CNM

## 2017-04-12 NOTE — Patient Instructions (Signed)

## 2017-04-15 ENCOUNTER — Encounter (HOSPITAL_COMMUNITY): Payer: Self-pay

## 2017-04-18 ENCOUNTER — Ambulatory Visit (INDEPENDENT_AMBULATORY_CARE_PROVIDER_SITE_OTHER): Payer: BLUE CROSS/BLUE SHIELD | Admitting: Obstetrics & Gynecology

## 2017-04-18 VITALS — BP 114/72 | HR 90 | Wt 203.0 lb

## 2017-04-18 DIAGNOSIS — Z3480 Encounter for supervision of other normal pregnancy, unspecified trimester: Secondary | ICD-10-CM

## 2017-04-18 DIAGNOSIS — Z3483 Encounter for supervision of other normal pregnancy, third trimester: Secondary | ICD-10-CM

## 2017-04-18 NOTE — Progress Notes (Signed)
   PRENATAL VISIT NOTE  Subjective:  Stacey Keith is a 31 y.o. G3P1011 at 7032w2d being seen today for ongoing prenatal care.  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  Patient reports no complaints.  Contractions: Irritability. Vag. Bleeding: None.  Movement: Present. Denies leaking of fluid.   The following portions of the patient's history were reviewed and updated as appropriate: allergies, current medications, past family history, past medical history, past social history, past surgical history and problem list. Problem list updated.  Objective:   Vitals:   04/18/17 1548  BP: 114/72  Pulse: 90  Weight: 203 lb (92.1 kg)    Fetal Status: Fetal Heart Rate (bpm): 136   Movement: Present     General:  Alert, oriented and cooperative. Patient is in no acute distress.  Skin: Skin is warm and dry. No rash noted.   Cardiovascular: Normal heart rate noted  Respiratory: Normal respiratory effort, no problems with respiration noted  Abdomen: Soft, gravid, appropriate for gestational age.  Pain/Pressure: Present     Pelvic: Cervical exam deferred        Extremities: Normal range of motion.  Edema: Trace  Mental Status:  Normal mood and affect. Normal behavior. Normal judgment and thought content.   Assessment and Plan:  Pregnancy: G3P1011 at 2732w2d  There are no diagnoses linked to this encounter. Term labor symptoms and general obstetric precautions including but not limited to vaginal bleeding, contractions, leaking of fluid and fetal movement were reviewed in detail with the patient. Please refer to After Visit Summary for other counseling recommendations.  Return if symptoms worsen or fail to improve, for pp  visit. CS in 5 days  Scheryl DarterJames Bita Cartwright, MD

## 2017-04-18 NOTE — Patient Instructions (Signed)
Cesarean Delivery °Cesarean birth, or cesarean delivery, is the surgical delivery of a baby through an incision in the abdomen and the uterus. This may be referred to as a C-section. This procedure may be scheduled ahead of time, or it may be done in an emergency situation. °Tell a health care provider about: °· Any allergies you have. °· All medicines you are taking, including vitamins, herbs, eye drops, creams, and over-the-counter medicines. °· Any problems you or family members have had with anesthetic medicines. °· Any blood disorders you have. °· Any surgeries you have had. °· Any medical conditions you have. °· Whether you or any members of your family have a history of deep vein thrombosis (DVT) or pulmonary embolism (PE). °What are the risks? °Generally, this is a safe procedure. However, problems may occur, including: °· Infection. °· Bleeding. °· Allergic reactions to medicines. °· Damage to other structures or organs. °· Blood clots. °· Injury to your baby. ° °What happens before the procedure? °· Follow instructions from your health care provider about eating or drinking restrictions. °· Follow instructions from your health care provider about bathing before your procedure to help reduce your risk of infection. °· If you know that you are going to have a cesarean delivery, do not shave your pubic area. Shaving before the procedure may increase your risk of infection. °· Ask your health care provider about: °? Changing or stopping your regular medicines. This is especially important if you are taking diabetes medicines or blood thinners. °? Your pain management plan. This is especially important if you plan to breastfeed your baby. °? How long you will be in the hospital after the procedure. °? Any concerns you may have about receiving blood products if you need them during the procedure. °? Cord blood banking, if you plan to collect your baby’s umbilical cord blood. °· You may also want to ask your  health care provider: °? Whether you will be able to hold or breastfeed your baby while you are still in the operating room. °? Whether your baby can stay with you immediately after the procedure and during your recovery. °? Whether a family member or a person of your choice can go with you into the operating room and stay with you during the procedure, immediately after the procedure, and during your recovery. °· Plan to have someone drive you home when you are discharged from the hospital. °What happens during the procedure? °· Fetal monitors will be placed on your abdomen to monitor your heart rate and your baby's heart rate. °· Depending on the reason for your cesarean delivery, you may have a physical exam or additional testing, such as an ultrasound. °· An IV tube will be inserted into one of your veins. °· You may have your blood or urine tested. °· You will be given antibiotic medicine to help prevent infection. °· You may be given a special warming gown to wear to keep your temperature stable. °· Hair may be removed from your pubic area. °· The skin of your pubic area and lower abdomen will be cleaned with a germ-killing solution (antiseptic). °· A catheter may be inserted into your bladder through your urethra. This drains your urine during the procedure. °· You may be given one or more of the following: °? A medicine to numb the area (local anesthetic). °? A medicine to make you fall asleep (general anesthetic). °? A medicine (regional anesthetic) that is injected into your back or through a small   thin tube placed in your back (spinal anesthetic or epidural anesthetic). This numbs everything below the injection site and allows you to stay awake during your procedure. If this makes you feel nauseous, tell your health care provider. Medicines will be available to help reduce any nausea you may feel. °· An incision will be made in your abdomen, and then in your uterus. °· If you are awake during your  procedure, you may feel tugging and pulling in your abdomen, but you should not feel pain. If you feel pain, tell your health care provider immediately. °· Your baby will be removed from your uterus. You may feel more pressure or pushing while this happens. °· Immediately after birth, your baby will be dried and kept warm. You may be able to hold and breastfeed your baby. The umbilical cord may be clamped and cut during this time. °· Your placenta will be removed from your uterus. °· Your incisions will be closed with stitches (sutures). Staples, skin glue, or adhesive strips may also be applied to the incision in your abdomen. °· Bandages (dressings) will be placed over the incision in your abdomen. °The procedure may vary among health care providers and hospitals. °What happens after the procedure? °· Your blood pressure, heart rate, breathing rate, and blood oxygen level will be monitored often until the medicines you were given have worn off. °· You may continue to receive fluids and medicines through an IV tube. °· You will have some pain. Medicines will be available to help control your pain. °· To help prevent blood clots: °? You may be given medicines. °? You may have to wear compression stockings or devices. °? You will be encouraged to walk around when you are able. °· Hospital staff will encourage and support bonding with your baby. Your hospital may allow you and your baby to stay in the same room (rooming in) during your hospital stay to encourage successful breastfeeding. °· You may be encouraged to cough and breathe deeply often. This helps to prevent lung problems. °· If you have a catheter draining your urine, it will be removed as soon as possible after your procedure. °This information is not intended to replace advice given to you by your health care provider. Make sure you discuss any questions you have with your health care provider. °Document Released: 09/17/2005 Document Revised: 02/23/2016  Document Reviewed: 06/28/2015 °Elsevier Interactive Patient Education © 2017 [employer] ° °

## 2017-04-22 ENCOUNTER — Encounter (HOSPITAL_COMMUNITY)
Admission: RE | Admit: 2017-04-22 | Discharge: 2017-04-22 | Disposition: A | Discharge status: Home / Self Care | Payer: BLUE CROSS/BLUE SHIELD | Source: Ambulatory Visit | Attending: Family Medicine | Admitting: Family Medicine

## 2017-04-22 LAB — TYPE AND SCREEN
ABO/RH(D): A POS
Antibody Screen: NEGATIVE

## 2017-04-22 LAB — CBC
HEMATOCRIT: 33.3 % — AB (ref 36.0–46.0)
HEMOGLOBIN: 10.3 g/dL — AB (ref 12.0–15.0)
MCH: 25.6 pg — ABNORMAL LOW (ref 26.0–34.0)
MCHC: 30.9 g/dL (ref 30.0–36.0)
MCV: 82.6 fL (ref 78.0–100.0)
PLATELETS: 288 10*3/uL (ref 150–400)
RBC: 4.03 MIL/uL (ref 3.87–5.11)
RDW: 15.5 % (ref 11.5–15.5)
WBC: 9.2 10*3/uL (ref 4.0–10.5)

## 2017-04-22 LAB — RAPID HIV SCREEN (HIV 1/2 AB+AG)
HIV 1/2 Antibodies: NONREACTIVE
HIV-1 P24 Antigen - HIV24: NONREACTIVE

## 2017-04-22 NOTE — Patient Instructions (Signed)
20 Maryclare BeanMelinda Viney  04/22/2017   Your procedure is scheduled on:  04/23/2017  [facility] 1045 AM.  Pick up the phone at the desk and dial [phone].   Call this number if you have problems the morning of surgery: [phone]   Remember:   Do not eat food:After Midnight.  Do not drink clear liquids: After Midnight.  Take these medicines the morning of surgery with A SIP OF WATER: none   Do not wear jewelry, make-up or nail polish.  Do not wear lotions, powders, or perfumes. Do not wear deodorant.  Do not shave 48 hours prior to surgery.  Do not bring valuables to the hospital.  [facility] is not   responsible for any belongings or valuables brought to the hospital.  Contacts, dentures or bridgework may not be worn into surgery.  Leave suitcase in the car. After surgery it may be brought to your room.  For patients admitted to the hospital, checkout time is 11:00 AM the day of              discharge.   Patients discharged the day of surgery will not be allowed to drive             home.  Name and phone number of your driver: na  Special Instructions:   N/A   Please read over the following fact sheets that you were given:   Surgical Site Infection Prevention

## 2017-04-22 NOTE — Anesthesia Preprocedure Evaluation (Addendum)
Anesthesia Evaluation  Patient identified by MRN, date of birth, ID band Patient awake    Reviewed: Allergy & Precautions, NPO status , Patient's Chart, lab work & pertinent test results  Airway Mallampati: II       Dental no notable dental hx. (+) Teeth Intact   Pulmonary neg pulmonary ROS,    Pulmonary exam normal breath sounds clear to auscultation       Cardiovascular  Rhythm:Regular Rate:Normal     Neuro/Psych negative neurological ROS  negative psych ROS   GI/Hepatic negative GI ROS, Neg liver ROS,   Endo/Other  negative endocrine ROS  Renal/GU negative Renal ROS  negative genitourinary   Musculoskeletal   Abdominal (+) + obese,   Peds  Hematology  (+) Blood dyscrasia, anemia ,   Anesthesia Other Findings   Reproductive/Obstetrics (+) Pregnancy                            Anesthesia Physical Anesthesia Plan  ASA: II  Anesthesia Plan: Spinal   Post-op Pain Management:    Induction:   PONV Risk Score and Plan: 3 and Ondansetron, Dexamethasone, Propofol, Midazolam and Treatment may vary due to age or medical condition  Airway Management Planned:   Additional Equipment:   Intra-op Plan:   Post-operative Plan:   Informed Consent:   Plan Discussed with: CRNA and Surgeon  Anesthesia Plan Comments:        Anesthesia Quick Evaluation

## 2017-04-23 ENCOUNTER — Encounter (HOSPITAL_COMMUNITY)
Admission: RE | Disposition: A | Discharge status: Home / Self Care | Payer: Self-pay | Source: Ambulatory Visit | Attending: Family Medicine

## 2017-04-23 ENCOUNTER — Encounter (HOSPITAL_COMMUNITY): Payer: Self-pay | Admitting: *Deleted

## 2017-04-23 ENCOUNTER — Inpatient Hospital Stay (HOSPITAL_COMMUNITY): Payer: BLUE CROSS/BLUE SHIELD | Admitting: Anesthesiology

## 2017-04-23 ENCOUNTER — Inpatient Hospital Stay (HOSPITAL_COMMUNITY)
Admission: RE | Admit: 2017-04-23 | Discharge: 2017-04-25 | DRG: 766 | Disposition: A | Discharge status: Home / Self Care | Payer: BLUE CROSS/BLUE SHIELD | Source: Ambulatory Visit | Attending: Family Medicine | Admitting: Family Medicine

## 2017-04-23 DIAGNOSIS — Z98891 History of uterine scar from previous surgery: Secondary | ICD-10-CM

## 2017-04-23 DIAGNOSIS — E669 Obesity, unspecified: Secondary | ICD-10-CM | POA: Diagnosis present

## 2017-04-23 DIAGNOSIS — Z141 Cystic fibrosis carrier: Secondary | ICD-10-CM

## 2017-04-23 DIAGNOSIS — O99214 Obesity complicating childbirth: Secondary | ICD-10-CM | POA: Diagnosis present

## 2017-04-23 DIAGNOSIS — O34211 Maternal care for low transverse scar from previous cesarean delivery: Principal | ICD-10-CM | POA: Diagnosis present

## 2017-04-23 DIAGNOSIS — Z3A39 39 weeks gestation of pregnancy: Secondary | ICD-10-CM

## 2017-04-23 DIAGNOSIS — Z6837 Body mass index (BMI) 37.0-37.9, adult: Secondary | ICD-10-CM

## 2017-04-23 LAB — RPR: RPR Ser Ql: NONREACTIVE

## 2017-04-23 SURGERY — Surgical Case
Anesthesia: Spinal

## 2017-04-23 MED ORDER — DIPHENHYDRAMINE HCL 25 MG PO CAPS
25.0000 mg | ORAL_CAPSULE | Freq: Four times a day (QID) | ORAL | Status: DC | PRN
  Administered 2017-04-23 – 2017-04-24 (×2): 25 mg via ORAL
  Filled 2017-04-23 (×3): qty 1

## 2017-04-23 MED ORDER — MORPHINE SULFATE (PF) 0.5 MG/ML IJ SOLN
INTRAMUSCULAR | Status: DC | PRN
Start: 2017-04-23 — End: 2017-04-23
  Administered 2017-04-23: .2 mg via INTRATHECAL

## 2017-04-23 MED ORDER — OXYCODONE-ACETAMINOPHEN 5-325 MG PO TABS: 2.0000 | ORAL_TABLET | ORAL | Status: DC | PRN

## 2017-04-23 MED ORDER — FENTANYL CITRATE (PF) 100 MCG/2ML IJ SOLN
INTRAMUSCULAR | Status: DC | PRN
  Administered 2017-04-23: 20 ug via INTRATHECAL

## 2017-04-23 MED ORDER — OXYCODONE-ACETAMINOPHEN 5-325 MG PO TABS
1.0000 | ORAL_TABLET | ORAL | Status: DC | PRN
Start: 2017-04-23 — End: 2017-04-25

## 2017-04-23 MED ORDER — CEFAZOLIN SODIUM-DEXTROSE 2-3 GM-% IV SOLR
INTRAVENOUS | Status: DC | PRN
  Administered 2017-04-23: 2 g via INTRAVENOUS

## 2017-04-23 MED ORDER — SIMETHICONE 80 MG PO CHEW: 80.0000 mg | CHEWABLE_TABLET | ORAL | Status: DC | PRN

## 2017-04-23 MED ORDER — PHENYLEPHRINE 8 MG IN D5W 100 ML (0.08MG/ML) PREMIX OPTIME
INJECTION | INTRAVENOUS | Status: DC | PRN
  Administered 2017-04-23: 60 ug/min via INTRAVENOUS

## 2017-04-23 MED ORDER — OXYTOCIN 10 UNIT/ML IJ SOLN
INTRAVENOUS | Status: DC | PRN
  Administered 2017-04-23: 40 [IU] via INTRAVENOUS

## 2017-04-23 MED ORDER — BUPIVACAINE IN DEXTROSE 0.75-8.25 % IT SOLN
INTRATHECAL | Status: DC | PRN
Start: 2017-04-23 — End: 2017-04-23
  Administered 2017-04-23: 1.4 mL via INTRATHECAL

## 2017-04-23 MED ORDER — SODIUM CHLORIDE 0.9 % IR SOLN
Status: DC | PRN
  Administered 2017-04-23: 200 mL via INTRAVESICAL

## 2017-04-23 MED ORDER — IBUPROFEN 600 MG PO TABS
600.0000 mg | ORAL_TABLET | Freq: Four times a day (QID) | ORAL | Status: DC
  Administered 2017-04-23 – 2017-04-25 (×6): 600 mg via ORAL
  Filled 2017-04-23 (×5): qty 1

## 2017-04-23 MED ORDER — SCOPOLAMINE 1 MG/3DAYS TD PT72
MEDICATED_PATCH | TRANSDERMAL | Status: AC
  Filled 2017-04-23: qty 1

## 2017-04-23 MED ORDER — ACETAMINOPHEN 325 MG PO TABS: 650.0000 mg | ORAL_TABLET | ORAL | Status: DC | PRN

## 2017-04-23 MED ORDER — SIMETHICONE 80 MG PO CHEW
80.0000 mg | CHEWABLE_TABLET | ORAL | Status: DC
  Administered 2017-04-25: 80 mg via ORAL
  Filled 2017-04-23 (×3): qty 1

## 2017-04-23 MED ORDER — TETANUS-DIPHTH-ACELL PERTUSSIS 5-2.5-18.5 LF-MCG/0.5 IM SUSP: 0.5000 mL | Freq: Once | INTRAMUSCULAR | Status: DC

## 2017-04-23 MED ORDER — SCOPOLAMINE 1 MG/3DAYS TD PT72
1.0000 | MEDICATED_PATCH | Freq: Once | TRANSDERMAL | Status: DC
  Administered 2017-04-23: 1.5 mg via TRANSDERMAL

## 2017-04-23 MED ORDER — ONDANSETRON HCL 4 MG/2ML IJ SOLN
INTRAMUSCULAR | Status: DC | PRN
  Administered 2017-04-23: 4 mg via INTRAVENOUS

## 2017-04-23 MED ORDER — WITCH HAZEL-GLYCERIN EX PADS: 1.0000 "application " | MEDICATED_PAD | CUTANEOUS | Status: DC | PRN

## 2017-04-23 MED ORDER — ONDANSETRON HCL 4 MG/2ML IJ SOLN
INTRAMUSCULAR | Status: AC
  Filled 2017-04-23: qty 2

## 2017-04-23 MED ORDER — OXYTOCIN 40 UNITS IN LACTATED RINGERS INFUSION - SIMPLE MED: 2.5000 [IU]/h | INTRAVENOUS | Status: AC

## 2017-04-23 MED ORDER — SENNOSIDES-DOCUSATE SODIUM 8.6-50 MG PO TABS
2.0000 | ORAL_TABLET | ORAL | Status: DC
  Administered 2017-04-24 – 2017-04-25 (×2): 2 via ORAL
  Filled 2017-04-23 (×4): qty 2

## 2017-04-23 MED ORDER — LACTATED RINGERS IV SOLN
125.0000 mL/h | INTRAVENOUS | Status: DC
  Administered 2017-04-23 (×2): 125 mL/h via INTRAVENOUS
  Administered 2017-04-23: 13:00:00 via INTRAVENOUS

## 2017-04-23 MED ORDER — FENTANYL CITRATE (PF) 100 MCG/2ML IJ SOLN
INTRAMUSCULAR | Status: AC
  Filled 2017-04-23: qty 2

## 2017-04-23 MED ORDER — LACTATED RINGERS IV SOLN
INTRAVENOUS | Status: DC | PRN
  Administered 2017-04-23: 13:00:00 via INTRAVENOUS

## 2017-04-23 MED ORDER — SIMETHICONE 80 MG PO CHEW
80.0000 mg | CHEWABLE_TABLET | Freq: Three times a day (TID) | ORAL | Status: DC
  Administered 2017-04-23 – 2017-04-24 (×2): 80 mg via ORAL
  Filled 2017-04-23 (×7): qty 1

## 2017-04-23 MED ORDER — LACTATED RINGERS IV SOLN
INTRAVENOUS | Status: DC
Start: 2017-04-23 — End: 2017-04-25

## 2017-04-23 MED ORDER — MORPHINE SULFATE (PF) 0.5 MG/ML IJ SOLN
INTRAMUSCULAR | Status: AC
  Filled 2017-04-23: qty 10

## 2017-04-23 MED ORDER — MENTHOL 3 MG MT LOZG
1.0000 | LOZENGE | OROMUCOSAL | Status: DC | PRN
  Filled 2017-04-23: qty 9

## 2017-04-23 MED ORDER — OXYTOCIN 10 UNIT/ML IJ SOLN
INTRAMUSCULAR | Status: AC
  Filled 2017-04-23: qty 4

## 2017-04-23 MED ORDER — DIBUCAINE 1 % RE OINT: 1.0000 "application " | TOPICAL_OINTMENT | RECTAL | Status: DC | PRN

## 2017-04-23 MED ORDER — CEFAZOLIN SODIUM-DEXTROSE 2-4 GM/100ML-% IV SOLN
2.0000 g | INTRAVENOUS | Status: DC
  Filled 2017-04-23: qty 100

## 2017-04-23 MED ORDER — PRENATAL MULTIVITAMIN CH
1.0000 | ORAL_TABLET | Freq: Every day | ORAL | Status: DC
  Administered 2017-04-24: 1 via ORAL
  Filled 2017-04-23 (×2): qty 1

## 2017-04-23 MED ORDER — COCONUT OIL OIL: 1.0000 "application " | TOPICAL_OIL | Status: DC | PRN

## 2017-04-23 MED ORDER — ZOLPIDEM TARTRATE 5 MG PO TABS: 5.0000 mg | ORAL_TABLET | Freq: Every evening | ORAL | Status: DC | PRN

## 2017-04-23 SURGICAL SUPPLY — 33 items
BENZOIN TINCTURE PRP APPL 2/3 (GAUZE/BANDAGES/DRESSINGS) ×4 IMPLANT
CHLORAPREP W/TINT 26ML (MISCELLANEOUS) ×2 IMPLANT
CLAMP CORD UMBIL (MISCELLANEOUS) IMPLANT
CLOTH BEACON ORANGE TIMEOUT ST (SAFETY) ×2 IMPLANT
DRSG OPSITE POSTOP 4X10 (GAUZE/BANDAGES/DRESSINGS) ×2 IMPLANT
ELECT REM PT RETURN 9FT ADLT (ELECTROSURGICAL) ×2
ELECTRODE REM PT RTRN 9FT ADLT (ELECTROSURGICAL) ×1 IMPLANT
EXTRACTOR VACUUM M CUP 4 TUBE (SUCTIONS) IMPLANT
GAUZE SPONGE 4X4 12PLY STRL LF (GAUZE/BANDAGES/DRESSINGS) ×4 IMPLANT
GLOVE BIOGEL PI IND STRL 7.0 (GLOVE) ×2 IMPLANT
GLOVE BIOGEL PI IND STRL 7.5 (GLOVE) ×2 IMPLANT
GLOVE BIOGEL PI INDICATOR 7.0 (GLOVE) ×2
GLOVE BIOGEL PI INDICATOR 7.5 (GLOVE) ×2
GLOVE ECLIPSE 7.5 STRL STRAW (GLOVE) ×2 IMPLANT
GOWN STRL REUS W/TWL LRG LVL3 (GOWN DISPOSABLE) ×6 IMPLANT
KIT ABG SYR 3ML LUER SLIP (SYRINGE) IMPLANT
NEEDLE HYPO 25X5/8 SAFETYGLIDE (NEEDLE) IMPLANT
NS IRRIG 1000ML POUR BTL (IV SOLUTION) ×2 IMPLANT
PACK C SECTION WH (CUSTOM PROCEDURE TRAY) ×2 IMPLANT
PAD ABD 7.5X8 STRL (GAUZE/BANDAGES/DRESSINGS) ×2 IMPLANT
PAD OB MATERNITY 4.3X12.25 (PERSONAL CARE ITEMS) ×2 IMPLANT
PENCIL SMOKE EVAC W/HOLSTER (ELECTROSURGICAL) ×2 IMPLANT
RTRCTR C-SECT PINK 25CM LRG (MISCELLANEOUS) ×2 IMPLANT
STRIP CLOSURE SKIN 1/2X4 (GAUZE/BANDAGES/DRESSINGS) ×4 IMPLANT
SUT PLAIN 2 0 (SUTURE) ×1
SUT PLAIN ABS 2-0 CT1 27XMFL (SUTURE) ×1 IMPLANT
SUT VIC AB 0 CTX 36 (SUTURE) ×4
SUT VIC AB 0 CTX36XBRD ANBCTRL (SUTURE) ×4 IMPLANT
SUT VIC AB 2-0 CT1 27 (SUTURE) ×1
SUT VIC AB 2-0 CT1 TAPERPNT 27 (SUTURE) ×1 IMPLANT
SUT VIC AB 4-0 KS 27 (SUTURE) ×2 IMPLANT
TOWEL OR 17X24 6PK STRL BLUE (TOWEL DISPOSABLE) ×2 IMPLANT
TRAY FOLEY BAG SILVER LF 14FR (SET/KITS/TRAYS/PACK) ×2 IMPLANT

## 2017-04-23 NOTE — Anesthesia Postprocedure Evaluation (Signed)
Anesthesia Post Note  Patient: Stacey BeanMelinda Keith  Procedure(s) Performed: Procedure(s) (LRB): CESAREAN SECTION (N/A)     Patient location during evaluation: Mother Baby Anesthesia Type: Spinal Level of consciousness: awake and alert and oriented Pain management: satisfactory to patient Vital Signs Assessment: post-procedure vital signs reviewed and stable Respiratory status: respiratory function stable and spontaneous breathing Cardiovascular status: blood pressure returned to baseline Postop Assessment: no headache, no backache, spinal receding, patient able to bend at knees, adequate PO intake and no signs of nausea or vomiting Anesthetic complications: no    Last Vitals:  Vitals:   04/23/17 1740 04/23/17 1840  BP: (!) 108/53 (!) 114/59  Pulse: 90 99  Resp: 18 18  Temp: 36.7 C 36.7 C    Last Pain:  Vitals:   04/23/17 1840  TempSrc: Axillary   Pain Goal: Patients Stated Pain Goal: 0 (04/23/17 1740)               [name]

## 2017-04-23 NOTE — Anesthesia Procedure Notes (Signed)
Spinal  Start time: 04/23/2017 12:38 PM End time: 04/23/2017 12:41 PM Staffing Anesthesiologist: Leilani AbleHATCHETT, Josee Speece Performed: anesthesiologist  Preanesthetic Checklist Completed: patient identified, surgical consent, pre-op evaluation, timeout performed, IV checked, risks and benefits discussed and monitors and equipment checked Spinal Block Patient position: sitting Prep: site prepped and draped and DuraPrep Patient monitoring: heart rate, cardiac monitor, continuous pulse ox and blood pressure Approach: midline Location: L3-4 Injection technique: single-shot Needle Needle type: Pencan  Needle length: 10 cm Needle insertion depth: 6 cm Assessment Sensory level: T4

## 2017-04-23 NOTE — Transfer of Care (Signed)
Immediate Anesthesia Transfer of Care Note  Patient: Stacey BeanMelinda Keith  Procedure(s) Performed: Procedure(s): CESAREAN SECTION (N/A)  Patient Location: PACU  Anesthesia Type:Spinal  Level of Consciousness: awake, alert , oriented and patient cooperative  Airway & Oxygen Therapy: Patient Spontanous Breathing  Post-op Assessment: Report given to RN and Post -op Vital signs reviewed and stable  Post vital signs: Reviewed and stable  Last Vitals:  Vitals:   04/23/17 1103  BP: 124/69  Pulse: (!) 101  Resp: 20  Temp: 36.7 C    Last Pain:  Vitals:   04/23/17 1103  TempSrc: Oral      Patients Stated Pain Goal: 4 (04/23/17 1103)  Complications: No apparent anesthesia complications

## 2017-04-23 NOTE — Op Note (Signed)
Stacey Keith PROCEDURE DATE: 04/23/2017  PREOPERATIVE DIAGNOSIS: Intrauterine pregnancy at  [redacted]w[redacted]d weeks gestation; elective repeat  POSTOPERATIVE DIAGNOSIS: The same  PROCEDURE: Elective Repeat Low Transverse Cesarean Section  SURGEON:  Dr. Candelaria Keith  ASSISTANT: Dr Stacey Keith  INDICATIONS: Stacey Keith is a 31 y.o. [id number] at [redacted]w[redacted]d scheduled for cesarean section secondary to elective repeat.  The risks of cesarean section discussed with the patient included but were not limited to: bleeding which may require transfusion or reoperation; infection which may require antibiotics; injury to bowel, bladder, ureters or other surrounding organs; injury to the fetus; need for additional procedures including hysterectomy in the event of a life-threatening hemorrhage; placental abnormalities wth subsequent pregnancies, incisional problems, thromboembolic phenomenon and other postoperative/anesthesia complications. The patient concurred with the proposed plan, giving informed written consent for the procedure.    FINDINGS:  Viable female infant in vertex presentation.  Apgars 8 and 9, weight pending.  Clear amniotic fluid.  Intact placenta, three vessel cord.  Normal uterus, fallopian tubes and ovaries bilaterally.  ANESTHESIA:    Spinal INTRAVENOUS FLUIDS:2500 ml ESTIMATED BLOOD LOSS: 700 ml URINE OUTPUT:  600 ml SPECIMENS: Placenta sent to L&D COMPLICATIONS: None immediate  PROCEDURE IN DETAIL:  The patient received intravenous antibiotics and had sequential compression devices applied to her lower extremities while in the preoperative area.  She was then taken to the operating room where spinal anesthesia was administered and was found to be adequate. She was then placed in a dorsal supine position with a leftward tilt, and prepped and draped in a sterile manner.  A foley catheter was placed into her bladder and attached to constant gravity, which drained clear fluid throughout.  After an  adequate timeout was performed, a Pfannenstiel skin incision was made with scalpel and carried through to the underlying layer of fascia. The fascia was incised in the midline and this incision was extended bilaterally using the Mayo scissors. Kocher clamps were applied to the superior aspect of the fascial incision and the underlying rectus muscles were dissected off bluntly. A similar process was carried out on the inferior aspect of the facial incision. The rectus muscles were separated in the midline bluntly and the peritoneum was entered bluntly. An Alexis retractor was placed to aid in visualization of the uterus.  Attention was turned to the lower uterine segment where a transverse hysterotomy was made with a scalpel and extended bilaterally bluntly. The infant was successfully delivered, and cord was clamped and cut and infant was handed over to awaiting neonatology team. Uterine massage was then administered and the placenta delivered intact with three-vessel cord. The uterus was then cleared of clot and debris.  The hysterotomy was closed with 0 Vicryl in a running locked fashion, and an imbricating layer was also placed with a 0 Vicryl. Overall, excellent hemostasis was noted. The abdomen and the pelvis were cleared of all clot and debris and the Jon Gills was removed. Hemostasis was confirmed on all surfaces.  There was a small 3-4cm hematoma on the left side of the hysterotomy that was not increasing in size. The peritoneum was reapproximated using 2-0 vicryl running stitches. The fascia was then closed using 0 Vicryl in a running fashion. The subcutaneous layer was reapproximated with plain gut and the skin was closed with 4-0 vicryl. The patient tolerated the procedure well. Sponge, lap, instrument and needle counts were correct x 2. She was taken to the recovery room in stable condition.    [name], DO 04/23/2017  1:57 PM

## 2017-04-23 NOTE — H&P (Signed)
Faculty Practice H&P  Stacey Keith is a 31 y.o. female G3P1011 with IUP at [redacted]w[redacted]d presenting for elective cesarean section for repeat. Pregnancy was been complicated by CF carrier.    Pt states she has been having no contractions, no vaginal bleeding, intact membranes, with normal fetal movement.     Prenatal Course Source of Care: [facility] with onset of care at 10 weeks  Pregnancy complications or risks: Patient Active Problem List   Diagnosis Date Noted  . Supervision of other normal pregnancy, antepartum 10/04/2016  . Previous cesarean delivery, delivered 10/04/2016  . Obesity in pregnancy 10/04/2016  . 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 Septate uterus 10/12/2014  . Oligomenorrhea 05/23/2013  . Hirsutism 05/23/2013   She desires vasectomy for contraception.  She plans to breastfeed  Prenatal labs and studies: ABO, Rh: --/--/A POS (07/23 1027) Antibody: NEG (07/23 1027) Rubella: !Error! RPR: Non Reactive (07/23 1036)  HBsAg: NEGATIVE (01/04 1345)  HIV: NONREACTIVE (05/04 0836)  GBS:    2hr Glucola: negative Genetic screening: normal Anatomy US: normal  Past Medical History:  Past Medical History:  Diagnosis Date  . Mastitis 04/2015  . Medical history non-contributory     Past Surgical History:  Past Surgical History:  Procedure Laterality Date  . ABDOMINAL HYSTERECTOMY    . CESAREAN SECTION N/A 04/07/2015   Procedure: CESAREAN SECTION;  Surgeon: Reva Bores, MD;  Location: [facility];  Service: Obstetrics;  Laterality: N/A;  . EAR TUBE REMOVAL Right    20 YEARS AGO  . UTERINE SEPTUM RESECTION  04/27/2014  . WISDOM TOOTH EXTRACTION      Obstetrical History:  OB History    Gravida Para Term Preterm AB Living   3 1 1   1 1    SAB TAB Ectopic Multiple Live Births   1 0 0 0 1      Gynecological History:  OB History    Gravida Para Term Preterm AB Living   3 1 1   1 1    SAB TAB Ectopic Multiple Live Births   1 0 0 0 1       Social History:  Social History   Social History  . Marital status: Married    Spouse name: N/A  . Number of children: N/A  . Years of education: N/A   Social History Main Topics  . Smoking status: Never Smoker  . Smokeless tobacco: Never Used  . Alcohol use No  . Drug use: No  . Sexual activity: Yes    Birth control/ protection: None   Other Topics Concern  . None   Social History Narrative  . None    Family History:  Family History  Problem Relation Age of Onset  . Breast cancer Paternal Grandmother   . Cancer Paternal Grandmother        BREAST  . Cancer Maternal Grandmother        BREAST  . Stroke Maternal Grandmother   . Cancer Maternal Grandfather        KIDNEY  . Cancer Paternal Grandfather        PROSTATE    Medications:  Prenatal vitamins,  Current Facility-Administered Medications  Medication Dose Route Frequency Provider Last Rate Last Dose  . ceFAZolin (ANCEF) IVPB 2g/100 mL premix  2 g Intravenous On Call to OR [name], DO      . lactated ringers infusion  125 mL/hr Intravenous Continuous [name], DO 125 mL/hr at 04/23/17 1104 125  mL/hr at 04/23/17 1104    Allergies: No Known Allergies  Review of Systems: - negative  Physical Exam: Blood pressure 124/69, pulse (!) 101, temperature 98.1 F (36.7 C), temperature source Oral, resp. rate 20, height 5\' 2"  (1.575 m), weight 205 lb (93 kg), last menstrual period 07/24/2016. GENERAL: Well-developed, well-nourished female in no acute distress.  LUNGS: Clear to auscultation bilaterally.  HEART: Regular rate and rhythm. ABDOMEN: Soft, nontender, nondistended, gravid. EFW 8 lbs EXTREMITIES: Nontender, no edema, 2+ distal pulses FHT:  Baseline rate 135 bpm     Pertinent Labs/Studies:   Lab Results  Component Value Date   WBC 9.2 04/22/2017   HGB 10.3 (L) 04/22/2017   HCT 33.3 (L) 04/22/2017   MCV 82.6 04/22/2017   PLT 288 04/22/2017    Assessment : Stacey Keith is a 31 y.o.  G3P1011 at 2076w0d being admitted for cesarean section secondary to elective repeat  Plan: The risks of cesarean section discussed with the patient included but were not limited to: bleeding which may require transfusion or reoperation; infection which may require antibiotics; injury to bowel, bladder, ureters or other surrounding organs; injury to the fetus; need for additional procedures including hysterectomy in the event of a life-threatening hemorrhage; placental abnormalities wth subsequent pregnancies, incisional problems, thromboembolic phenomenon and other postoperative/anesthesia complications. The patient concurred with the proposed plan, giving informed written consent for the procedure.   Patient has been NPO since midnight and will remain NPO for procedure.  Preoperative prophylactic Ancef ordered on call to the OR.    Levie HeritageStinson, Christien Frankl J, DO 04/23/2017, 11:55 AM

## 2017-04-24 LAB — CBC
HCT: 28.5 % — ABNORMAL LOW (ref 36.0–46.0)
Hemoglobin: 9 g/dL — ABNORMAL LOW (ref 12.0–15.0)
MCH: 26 pg (ref 26.0–34.0)
MCHC: 31.6 g/dL (ref 30.0–36.0)
MCV: 82.4 fL (ref 78.0–100.0)
PLATELETS: 238 10*3/uL (ref 150–400)
RBC: 3.46 MIL/uL — AB (ref 3.87–5.11)
RDW: 16 % — AB (ref 11.5–15.5)
WBC: 9.5 10*3/uL (ref 4.0–10.5)

## 2017-04-24 LAB — BIRTH TISSUE RECOVERY COLLECTION (PLACENTA DONATION)

## 2017-04-24 NOTE — Lactation Note (Signed)
This note was copied from a baby's chart. Lactation Consultation Note  Patient Name: Stacey Maryclare BeanMelinda Keith EAVWU'JToday's Date: 04/24/2017 Reason for consult: Initial assessment Breastfeeding consultation services and support information given and reviewed.  This is mom's second baby and she nursed her first baby.  She has a history of mastitis x 2 with previous baby.  Mom feels baby is feeding well.  Baby is latched well now and finishing feeding.  Baby came off content and relaxed.  Reviewed waking techniques and breast massage.  Instructed to feed with any feeding cue and to call with concerns/assist prn.  Maternal Data Does the patient have breastfeeding experience prior to this delivery?: Yes  Feeding Feeding Type: Breast Fed Length of feed: 5 min  LATCH Score/Interventions Latch: Grasps breast easily, tongue down, lips flanged, rhythmical sucking.  Audible Swallowing: A few with stimulation Intervention(s): Skin to skin  Type of Nipple: Everted at rest and after stimulation  Comfort (Breast/Nipple): Soft / non-tender     Hold (Positioning): Assistance needed to correctly position infant at breast and maintain latch.  LATCH Score: 8  Lactation Tools Discussed/Used     Consult Status Consult Status: Follow-up Date: 04/25/17 Follow-up type: In-patient    Huston FoleyMOULDEN, Denetta Fei S 04/24/2017, 2:34 PM

## 2017-04-24 NOTE — Progress Notes (Signed)
Subjective: Postpartum Day #1: Cesarean Delivery Patient reports tolerating PO and no problems voiding.  Breastfeeding mostly going well; plans vasectomy for contraception. Denies dizziness or s/s anemia; ambulating well.   Objective: Vital signs in last 24 hours: Temp:  [97.6 F (36.4 C)-98.1 F (36.7 C)] 98.1 F (36.7 C) (07/25 0517) Pulse Rate:  [60-101] 71 (07/25 0517) Resp:  [15-20] 20 (07/25 0517) BP: (91-124)/(53-89) 91/55 (07/25 0517) SpO2:  [99 %-100 %] 99 % (07/24 1740) Weight:  [93 kg (205 lb)] 93 kg (205 lb) (07/24 1103)  Physical Exam:  General: alert, cooperative and mild distress Lochia: appropriate Uterine Fundus: firm Incision: pressure dsg intact, dry DVT Evaluation: No evidence of DVT seen on physical exam.   Recent Labs  04/22/17 1036 04/24/17 0509  HGB 10.3* 9.0*  HCT 33.3* 28.5*    Assessment/Plan: Status post Cesarean section. Doing well postoperatively.  Continue current care. Anticipate d/c 04/25/17.  Rice Walsh CNM 04/24/2017, 9:00 AM

## 2017-04-25 MED ORDER — OXYCODONE-ACETAMINOPHEN 5-325 MG PO TABS: 1.0000 | ORAL_TABLET | ORAL | 0 refills | Status: DC | PRN

## 2017-04-25 MED ORDER — IBUPROFEN 600 MG PO TABS: 600.0000 mg | ORAL_TABLET | Freq: Four times a day (QID) | ORAL | 0 refills | Status: DC

## 2017-04-25 NOTE — Discharge Instructions (Signed)
Contraception Choices °Contraception, also called birth control, means things to use or ways to try not to get pregnant. °Hormonal birth control °This kind of birth control uses hormones. Here are some types of hormonal birth control: °· A tube that is put under skin of the arm (implant). The tube can stay in for as long as 3 years. °· Shots to get every 3 months (injections). °· Pills to take every day (birth control pills). °· A patch to change 1 time each week for 3 weeks (birth control patch). After that, the patch is taken off for 1 week. °· A ring to put in the vagina. The ring is left in for 3 weeks. Then it is taken out of the vagina for 1 week. Then a new ring is put in. °· Pills to take after unprotected sex (emergency birth control pills). ° °Barrier birth control °Here are some types of barrier birth control: °· A thin covering that is put on the penis before sex (female condom). The covering is thrown away after sex. °· A soft, loose covering that is put in the vagina before sex (female condom). The covering is thrown away after sex. °· A rubber bowl that sits over the cervix (diaphragm). The bowl must be made for you. The bowl is put into the vagina before sex. The bowl is left in for 6-8 hours after sex. It is taken out within 24 hours. °· A small, soft cup that fits over the cervix (cervical cap). The cup must be made for you. The cup can be left in for 6-8 hours after sex. It is taken out within 48 hours. °· A sponge that is put into the vagina before sex. It must be left in for at least 6 hours after sex. It must be taken out within 30 hours. Then it is thrown away. °· A chemical that kills or stops sperm from getting into the uterus (spermicide). It may be a pill, cream, jelly, or foam to put in the vagina. The chemical should be used at least 10-15 minutes before sex. ° °IUD (intrauterine) birth control °An IUD is a small, T-shaped piece of plastic. It is put inside the uterus. There are two  kinds: °· Hormone IUD. This kind can stay in for 3-5 years. °· Copper IUD. This kind can stay in for 10 years. ° °Permanent birth control °Here are some types of permanent birth control: °· Surgery to block the fallopian tubes. °· Having an insert put into each fallopian tube. °· Surgery to tie off the tubes that carry sperm (vasectomy). ° °Natural planning birth control °Here are some types of natural planning birth control: °· Not having sex on the days the woman could get pregnant. °· Using a calendar: °? To keep track of the length of each period. °? To find out what days pregnancy can happen. °? To plan to not have sex on days when pregnancy can happen. °· Watching for symptoms of ovulation and not having sex during ovulation. One way the woman can check for ovulation is to check her temperature. °· Waiting to have sex until after ovulation. ° °Summary °· Contraception, also called birth control, means things to use or ways to try not to get pregnant. °· Hormonal methods of birth control include implants, injections, pills, patches, vaginal rings, and emergency birth control pills. °· Barrier methods of birth control can include female condoms, female condoms, diaphragms, cervical caps, sponges, and spermicides. °· There are two types of   IUD (intrauterine device) birth control. An IUD can be put in a woman's uterus to prevent pregnancy for 3-5 years.  Permanent sterilization can be done through a procedure for males, females, or both.  Natural planning methods involve not having sex on the days when the woman could get pregnant. This information is not intended to replace advice given to you by your health care provider. Make sure you discuss any questions you have with your health care provider. Document Released: 07/15/2009 Document Revised: 09/27/2016 Document Reviewed: 09/27/2016 Elsevier Interactive Patient Education  2017 [employer] Cesarean Delivery, Care After Refer to this sheet in the next  few weeks. These instructions provide you with information about caring for yourself after your procedure. Your health care provider may also give you more specific instructions. Your treatment has been planned according to current medical practices, but problems sometimes occur. Call your health care provider if you have any problems or questions after your procedure. What can I expect after the procedure? After the procedure, it is common to have:  A small amount of blood or clear fluid coming from the incision.  Some redness, swelling, and pain in your incision area.  Some abdominal pain and soreness.  Vaginal bleeding (lochia).  Pelvic cramps.  Fatigue.  Follow these instructions at home: Incision care   Follow instructions from your health care provider about how to take care of your incision. Make sure you: ? Wash your hands with soap and water before you change your bandage (dressing). If soap and water are not available, use hand sanitizer. ? Change your dressing as told by your health care provider. ? Leave stitches (sutures), skin staples, skin glue, or adhesive strips in place. These skin closures may need to stay in place for 2 weeks or longer. If adhesive strip edges start to loosen and curl up, you may trim the loose edges. Do not remove adhesive strips completely unless your health care provider tells you to do that.  Check your incision area every day for signs of infection. Check for: ? More redness, swelling, or pain. ? More fluid or blood. ? Warmth. ? Pus or a bad smell.  When you cough or sneeze, hug a pillow. This helps with pain and decreases the chance of your incision opening up (dehiscing). Do this until your incision heals. Medicines  Take over-the-counter and prescription medicines only as told by your health care provider.  If you were prescribed an antibiotic medicine, take it as told by your health care provider. Do not stop taking the antibiotic  until it is finished. Driving  Do not drive or operate heavy machinery while taking prescription pain medicine.  Do not drive for 24 hours if you received a sedative. Lifestyle  Do not drink alcohol. This is especially important if you are breastfeeding or taking pain medicine.  Do not use tobacco products, including cigarettes, chewing tobacco, or e-cigarettes. If you need help quitting, ask your health care provider. Tobacco can delay wound healing. Eating and drinking  Drink at least 8 eight-ounce glasses of water every day unless told not to by your health care provider. If you breastfeed, you may need to drink more water than this.  Eat high-fiber foods every day. These foods may help prevent or relieve constipation. High-fiber foods include: ? Whole grain cereals and breads. ? Brown rice. ? Beans. ? Fresh fruits and vegetables. Activity  Return to your normal activities as told by your health care provider. Ask your health care  provider what activities are safe for you.  Rest as much as possible. Try to rest or take a nap while your baby is sleeping.  Do not lift anything that is heavier than your baby or 10 lb (4.5 kg) as told by your health care provider.  Ask your health care provider when you can engage in sexual activity. This may depend on your: ? Risk of infection. ? Healing rate. ? Comfort and desire to engage in sexual activity. Bathing  Do not take baths, swim, or use a hot tub until your health care provider approves. Ask your health care provider if you can take showers. You may only be allowed to take sponge baths until your incision heals.  Keep your dressing dry as told by your health care provider. General instructions  Do not use tampons or douches until your health care provider approves.  Wear: ? Loose, comfortable clothing. ? A supportive and well-fitting bra.  Watch for any blood clots that may pass from your vagina. These may look like clumps  of dark red, brown, or black discharge.  Keep your perineum clean and dry as told by your health care provider.  Wipe from front to back when you use the toilet.  If possible, have someone help you care for your baby and help with household activities for a few days after you leave the hospital.  Keep all follow-up visits for you and your baby as told by your health care provider. This is important. Contact a health care provider if:  You have: ? Bad-smelling vaginal discharge. ? Difficulty urinating. ? Pain when urinating. ? A sudden increase or decrease in the frequency of your bowel movements. ? More redness, swelling, or pain around your incision. ? More fluid or blood coming from your incision. ? Pus or a bad smell coming from your incision. ? A fever. ? A rash. ? Little or no interest in activities you used to enjoy. ? Questions about caring for yourself or your baby. ? Nausea.  Your incision feels warm to the touch.  Your breasts turn red or become painful or hard.  You feel unusually sad or worried.  You vomit.  You pass large blood clots from your vagina. If you pass a blood clot, save it to show to your health care provider. Do not flush blood clots down the toilet without showing your health care provider.  You urinate more than usual.  You are dizzy or light-headed.  You have not breastfed and have not had a menstrual period for 12 weeks after delivery.  You stopped breastfeeding and have not had a menstrual period for 12 weeks after stopping breastfeeding. Get help right away if:  You have: ? Pain that does not go away or get better with medicine. ? Chest pain. ? Difficulty breathing. ? Blurred vision or spots in your vision. ? Thoughts about hurting yourself or your baby. ? New pain in your abdomen or in one of your legs. ? A severe headache.  You faint.  You bleed from your vagina so much that you fill two sanitary pads in one hour. This  information is not intended to replace advice given to you by your health care provider. Make sure you discuss any questions you have with your health care provider. Document Released: 06/09/2002 Document Revised: 01/26/2016 Document Reviewed: 08/22/2015 Elsevier Interactive Patient Education  2017 [employer]

## 2017-04-25 NOTE — Discharge Summary (Signed)
OB Discharge Summary  Patient Name: Stacey BeanMelinda Doverspike DOB: 01/06/1986 MRN: [id number]  Date of admission: 04/23/2017 Delivering MD: Levie HeritageSTINSON, JACOB J   Date of discharge: 04/25/2017  Admitting diagnosis: REPEAT CS Intrauterine pregnancy: 5363w0d     Secondary diagnosis:Active Problems:   S/P cesarean section  Additional problems:obesity     Discharge diagnosis: Term Pregnancy Delivered                                                                      Complications: None  Hospital course:  Sceduled C/S   31 y.o. yo [id number] at 3363w0d was admitted to the hospital 04/23/2017 for scheduled cesarean section with the following indication:Elective Repeat.  Membrane Rupture Time/Date: 1:02 PM ,04/23/2017   Patient delivered a Viable infant.04/23/2017  Details of operation can be found in separate operative note.  Pateint had an uncomplicated postpartum course.  She is ambulating, tolerating a regular diet, passing flatus, and urinating well. Patient is discharged home in stable condition on  04/25/17         Physical exam  Vitals:   04/24/17 0517 04/24/17 1100 04/24/17 1823 04/25/17 0609  BP: (!) 91/55 109/70 117/67 125/86  Pulse: 71 83 85 81  Resp: 20 16 18 18   Temp: 98.1 F (36.7 C) 98 F (36.7 C) 98.7 F (37.1 C) 97.9 F (36.6 C)  TempSrc:  Oral Oral Oral  SpO2:  99%    Weight:      Height:       General: alert Lochia: appropriate Uterine Fundus: firm Incision: Dressing is clean, dry, and intact DVT Evaluation: No evidence of DVT seen on physical exam. Labs: Lab Results  Component Value Date   WBC 9.5 04/24/2017   HGB 9.0 (L) 04/24/2017   HCT 28.5 (L) 04/24/2017   MCV 82.4 04/24/2017   PLT 238 04/24/2017   CMP Latest Ref Rng & Units 10/04/2016  Glucose 65 - 99 mg/dL 784(O109(H)  BUN 7 - 25 mg/dL -  Creatinine 9.620.50 - 9.521.10 mg/dL -  Sodium 841135 - 324146 mmol/L -  Potassium 3.5 - 5.3 mmol/L -  Chloride 98 - 110 mmol/L -  CO2 20 - 31 mmol/L -  Calcium 8.6 - 10.2 mg/dL -   Total Protein 6.1 - 8.1 g/dL -  Total Bilirubin 0.2 - 1.2 mg/dL -  Alkaline Phos 33 - 401115 U/L -  AST 10 - 30 U/L -  ALT 6 - 29 U/L -    Discharge instruction: per After Visit Summary and "Baby and Me Booklet".  After Visit Meds:  Allergies as of 04/25/2017   No Known Allergies     Medication List    TAKE these medications   acetaminophen 325 MG tablet Commonly known as:  TYLENOL Take 650 mg by mouth every 6 (six) hours as needed.   ibuprofen 600 MG tablet Commonly known as:  ADVIL,MOTRIN Take 1 tablet (600 mg total) by mouth every 6 (six) hours.   oxyCODONE-acetaminophen 5-325 MG tablet Commonly known as:  PERCOCET/ROXICET Take 1 tablet by mouth every 4 (four) hours as needed (pain scale 4-7).   prenatal multivitamin Tabs tablet Take 1 tablet by mouth daily at 12 noon.       Diet: routine  diet  Activity: Advance as tolerated. Pelvic rest for 6 weeks.   Outpatient follow up:4 weeks Follow up Appt:Future Appointments Date Time Provider Department Center  05/22/2017 9:00 AM Allie Bossierove, Tekla Malachowski C, MD [facility]   Follow up visit: No Follow-up on file.  Postpartum contraception: Condoms  Newborn Data: Live born female  Birth Weight: 6 lb 12.1 oz (3065 g) APGAR: 8, 9  Baby Feeding: Breast Disposition:home with mother   04/25/2017 Allie BossierMyra C Maudry Zeidan, MD

## 2017-04-25 NOTE — Progress Notes (Signed)
POSTPARTUM PROGRESS NOTE  Post opDay 2 Subjective:  Maryclare BeanMelinda Gatz is a 31 y.o. [id number] 5243w0d s/p C/S .  No acute events overnight.  Pt denies problems with ambulating, voiding or po intake.  She denies nausea or vomiting.  Pain is moderately controlled.  She has had flatus. She has had bowel movement.  Lochia Small.   Objective: Blood pressure 125/86, pulse 81, temperature 97.9 F (36.6 C), temperature source Oral, resp. rate 18, height 5\' 2"  (1.575 m), weight 93 kg (205 lb), last menstrual period 07/24/2016, SpO2 99 %, unknown if currently breastfeeding.  Physical Exam:  General: alert, cooperative and no distress Chest: CTAB Heart: RRR no m/r/g Abdomen: +BS, soft, nontender,  DVT Evaluation: No calf swelling or tenderness Extremities: trace edema Incision clean dry and intact, dressing shows minimal blood   Recent Labs  04/22/17 1036 04/24/17 0509  HGB 10.3* 9.0*  HCT 33.3* 28.5*    Assessment/Plan:  ASSESSMENT: Maryclare BeanMelinda Buell is a 31 y.o. [id number] 3843w0d s/p c/s 2 days ago  Discharge home   LOS: 2 days   Sullivan LoneBrannon L Jazline Cumbee, Medical Student

## 2017-04-25 NOTE — Lactation Note (Signed)
This note was copied from a baby's chart. Lactation Consultation Note  Patient Name: Girl Maryclare BeanMelinda Askari AVWUJ'WToday's Date: 04/25/2017  Mom and baby to be discharged today.  Mom states baby is latching well and cluster fed last night.  Baby is currently sleeping on mom's chest.  Reviewed engorgement treatment.  Lactation outpatient services and support information reviewed and encouraged prn.   Maternal Data    Feeding    LATCH Score/Interventions                      Lactation Tools Discussed/Used     Consult Status      Huston FoleyMOULDEN, Millan Legan S 04/25/2017, 10:47 AM

## 2017-04-30 ENCOUNTER — Encounter (INDEPENDENT_AMBULATORY_CARE_PROVIDER_SITE_OTHER): Payer: Self-pay | Admitting: *Deleted

## 2017-05-22 ENCOUNTER — Encounter: Payer: Self-pay | Admitting: Obstetrics & Gynecology

## 2017-05-22 ENCOUNTER — Ambulatory Visit (INDEPENDENT_AMBULATORY_CARE_PROVIDER_SITE_OTHER): Payer: BLUE CROSS/BLUE SHIELD | Admitting: Obstetrics & Gynecology

## 2017-05-22 DIAGNOSIS — Z3689 Encounter for other specified antenatal screening: Secondary | ICD-10-CM

## 2017-05-22 NOTE — Progress Notes (Signed)
Post Partum Exam  Stacey Keith is a 31 y.o. [phone] female who presents for a postpartum visit. She is 4 weeks postpartum following a low cervical transverse Cesarean section. I have fully reviewed the prenatal and intrapartum course. The delivery was at 39w gestational weeks.  Anesthesia: spinal. Postpartum course has been unremarkable. Baby's course has been unremarkable. Baby is feeding by breast. Bleeding no bleeding. Bowel function is normal. Bladder function is normal. Patient is sexually active. Contraception method is condoms. Husband plans a vasectomy in the future.  Postpartum depression screening:neg  The following portions of the patient's history were reviewed and updated as appropriate: allergies, current medications, past family history, past medical history, past social history, past surgical history and problem list.  Review of Systems Pertinent items are noted in HPI.    Objective:  unknown if currently breastfeeding.  General:  alert   Breasts:  inspection negative, no nipple discharge or bleeding, no masses or nodularity palpable  Lungs: clear to auscultation bilaterally  Heart:  regular rate and rhythm, S1, S2 normal, no murmur, click, rub or gallop  Abdomen: soft, non-tender; bowel sounds normal; no masses,  no organomegaly   Vulva:  not evaluated  Vagina: not evaluated  Cervix:  not evaluated  Corpus: not examined  Adnexa:  not evaluated  Rectal Exam: Not performed.        Assessment:    Normal postpartum exam. Pap smear not done at today's visit.   Plan:   1. Contraception: condoms and vasectomy 2. We discussed ACOG recs for pap in 2 more years

## 2017-10-10 ENCOUNTER — Encounter: Payer: Self-pay | Admitting: Osteopathic Medicine

## 2017-10-10 ENCOUNTER — Ambulatory Visit (INDEPENDENT_AMBULATORY_CARE_PROVIDER_SITE_OTHER): Payer: BLUE CROSS/BLUE SHIELD | Admitting: Osteopathic Medicine

## 2017-10-10 VITALS — BP 99/63 | HR 54 | Temp 97.9°F | Ht 61.0 in | Wt 170.6 lb

## 2017-10-10 DIAGNOSIS — Z0000 Encounter for general adult medical examination without abnormal findings: Secondary | ICD-10-CM

## 2017-10-10 DIAGNOSIS — Z23 Encounter for immunization: Secondary | ICD-10-CM

## 2017-10-10 LAB — COMPLETE METABOLIC PANEL WITH GFR
AG Ratio: 1.5 (calc) (ref 1.0–2.5)
ALKALINE PHOSPHATASE (APISO): 57 U/L (ref 33–115)
ALT: 21 U/L (ref 6–29)
AST: 20 U/L (ref 10–30)
Albumin: 4.2 g/dL (ref 3.6–5.1)
BUN: 14 mg/dL (ref 7–25)
CO2: 27 mmol/L (ref 20–32)
CREATININE: 0.68 mg/dL (ref 0.50–1.10)
Calcium: 9.4 mg/dL (ref 8.6–10.2)
Chloride: 106 mmol/L (ref 98–110)
GFR, Est African American: 137 mL/min/{1.73_m2} (ref >=60)
GFR, Est Non African American: 118 mL/min/{1.73_m2} (ref >=60)
GLUCOSE: 80 mg/dL (ref 65–99)
Globulin: 2.8 g/dL (calc) (ref 1.9–3.7)
Potassium: 4.1 mmol/L (ref 3.5–5.3)
SODIUM: 140 mmol/L (ref 135–146)
Total Bilirubin: 0.4 mg/dL (ref 0.2–1.2)
Total Protein: 7 g/dL (ref 6.1–8.1)

## 2017-10-10 LAB — LIPID PANEL
CHOL/HDL RATIO: 5.5 (calc) — AB (ref <5.0)
Cholesterol: 224 mg/dL — ABNORMAL HIGH (ref <200)
HDL: 41 mg/dL — AB (ref >50)
LDL CHOLESTEROL (CALC): 159 mg/dL — AB
Non-HDL Cholesterol (Calc): 183 mg/dL (calc) — ABNORMAL HIGH (ref <130)
Triglycerides: 121 mg/dL (ref <150)

## 2017-10-10 LAB — CBC
HCT: 37.3 % (ref 35.0–45.0)
Hemoglobin: 12.3 g/dL (ref 11.7–15.5)
MCH: 26.1 pg — ABNORMAL LOW (ref 27.0–33.0)
MCHC: 33 g/dL (ref 32.0–36.0)
MCV: 79 fL — ABNORMAL LOW (ref 80.0–100.0)
MPV: 9.7 fL (ref 7.5–12.5)
Platelets: 322 10*3/uL (ref 140–400)
RBC: 4.72 10*6/uL (ref 3.80–5.10)
RDW: 13.8 % (ref 11.0–15.0)
WBC: 6.6 10*3/uL (ref 3.8–10.8)

## 2017-10-10 NOTE — Addendum Note (Signed)
Addended by: Delfino LovettHERNANDEZ, Jaydon Avina L on: 10/10/2017 08:34 AM   Modules accepted: Orders

## 2017-10-10 NOTE — Progress Notes (Signed)
HPI: Stacey Keith is a 32 y.o. female  who presents to [facility] today, 10/10/17,  for chief complaint of: Annual checkup    Baby is 31 months old, breastfeeding, patient is doing well. No concerns today.   FEMALE PREVENTIVE CARE  ANNUAL SCREENING/COUNSELING Tobacco - Never  Alcohol - none Diet/Exercise - HEALTHY HABITS DISCUSSED TO DECREASE CV RISK, walking for exercise  Sexual Health - Yes with female. STI - The patient denies history of sexually transmitted disease. INTERESTED IN STI TESTING - no Pregnancy prevention - planning for husband to get vasectomy, aware that breastfeeding is not reliable contraception   Depression - PQH2 Negative Domestic violence concerns - no HTN SCREENING - SEE VITALS Vaccination status - SEE BELOW  INFECTIOUS DISEASE SCREENING HIV - all adults 15-65 - does not need GC/CT - sexually active - does not need HepC - born 38-1963 - does not need TB - if risk/required by employer - does not need  DISEASE SCREENING Lipid - needs DM2 - needs Osteoporosis - does not need  CANCER SCREENING Cervical - does not need - NILM 05/22/16 Breast - does not need Lung - does not need Colon - does not need  ADULT VACCINATION Influenza - annual - already has Td booster every 10 years - already has HPV - age <7yo - she does not think she has had this series of vaccines, last Pap test was normal  Zoster - age 89+ - was not indicated Pneumonia - age 35+ sooner if risk (DM, smoker, other) - was not indicated   Past medical, surgical, social and family history reviewed: Patient Active Problem List   Diagnosis Date Noted  . S/P cesarean section 04/23/2017  . Supervision of other normal pregnancy, antepartum 10/04/2016  . Previous cesarean delivery, delivered 10/04/2016  . Obesity in pregnancy 10/04/2016  . 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 Septate uterus 10/12/2014  . Oligomenorrhea  05/23/2013  . Hirsutism 05/23/2013   Past Surgical History:  Procedure Laterality Date  . CESAREAN SECTION N/A 04/07/2015   Procedure: CESAREAN SECTION;  Surgeon: Reva Bores, MD;  Location: [facility];  Service: Obstetrics;  Laterality: N/A;  . CESAREAN SECTION N/A 04/23/2017   Procedure: CESAREAN SECTION;  Surgeon: [name], DO;  Location: [facility] SUIT[facility];  Service: Obstetrics;  Laterality: N/A;  . EAR TUBE REMOVAL Right    20 YEARS AGO  . UTERINE SEPTUM RESECTION  04/27/2014  . WISDOM TOOTH EXTRACTION     Social History   Tobacco Use  . Smoking status: Never Smoker  . Smokeless tobacco: Never Used  Substance Use Topics  . Alcohol use: No   Family History  Problem Relation Age of Onset  . Breast cancer Paternal Grandmother   . Cancer Paternal Grandmother        BREAST  . Cancer Maternal Grandmother        BREAST  . Stroke Maternal Grandmother   . Cancer Maternal Grandfather        KIDNEY  . Cancer Paternal Grandfather        PROSTATE     Current medication list and allergy/intolerance information reviewed:   Current Outpatient Medications  Medication Sig Dispense Refill  . acetaminophen (TYLENOL) 325 MG tablet Take 650 mg by mouth every 6 (six) hours as needed.    Marland Kitchen ibuprofen (ADVIL,MOTRIN) 600 MG tablet Take 1 tablet (600 mg total) by mouth every 6 (six) hours. 30 tablet 0  .  Prenatal Vit-Fe Fumarate-FA (PRENATAL MULTIVITAMIN) TABS tablet Take 1 tablet by mouth daily at 12 noon.     No current facility-administered medications for this visit.    No Known Allergies    Review of Systems:  Constitutional:  No  fever, no chills, No recent illness, No unintentional weight changes. No significant fatigue.   HEENT: No  headache  Cardiac: No  chest pain, No  pressure, No palpitations  Respiratory:  No  shortness of breath. No  Cough  Gastrointestinal: No  abdominal pain, No  nausea  Musculoskeletal: No new myalgia/arthralgia  Genitourinary: No   incontinence, No  abnormal genital bleeding, No abnormal genital discharge, no cramping  Skin: No  Rash, No other wounds/concerning lesions  Endocrine: No cold intolerance,  No heat intolerance  Neurologic: No  weakness, No  dizzines  Psychiatric: No  concerns with depression, No  concerns with anxiety, No sleep problems, No mood problems  Exam:  BP 99/63   Pulse (!) 54   Temp 97.9 F (36.6 C) (Oral)   Ht 5\' 1"  (1.549 m)   Wt 170 lb 9.6 oz (77.4 kg)   BMI 32.23 kg/m   Constitutional: VS see above. General Appearance: alert, well-developed, well-nourished, NAD  Eyes: Normal lids and conjunctive, non-icteric sclera  Ears, Nose, Mouth, Throat: MMM, Normal external inspection ears/nares/mouth/lips/gums. TM normal bilaterally. Pharynx/tonsils no erythema, no exudate. Nasal mucosa normal.   Neck: No masses, trachea midline. No thyroid enlargement. No tenderness/mass appreciated. No lymphadenopathy  Respiratory: Normal respiratory effort. no wheeze, no rhonchi, no rales  Cardiovascular: S1/S2 normal, no murmur, no rub/gallop auscultated. RRR. No lower extremity edema. Pedal pulse II/IV bilaterally DP and PT. No carotid bruit or JVD. No abdominal aortic bruit.  Gastrointestinal: Nontender, no masses. No hepatomegaly, no splenomegaly. No hernia appreciated. Bowel sounds normal. Rectal exam deferred.   Musculoskeletal: Gait normal. No clubbing/cyanosis of digits.   Neurological: Normal balance/coordination. No tremor.   Skin: warm, dry, intact. No rash/ulcer. No concerning nevi or subq nodules on limited exam.    Psychiatric: Normal judgment/insight. Normal mood and affect. Oriented x3.      ASSESSMENT/PLAN:   Annual physical exam: no abnormal findings carrier      Visit summary with medication list and pertinent instructions was printed for patient to review. All questions at time of visit were answered - patient instructed to contact office with any additional concerns.  ER/RTC precautions were reviewed with the patient. Follow-up plan: Return in about 1 year (around 10/10/2018) for ANNUAL PHYSICAL .

## 2017-12-10 ENCOUNTER — Encounter: Payer: Self-pay | Admitting: Osteopathic Medicine

## 2018-07-17 ENCOUNTER — Ambulatory Visit (INDEPENDENT_AMBULATORY_CARE_PROVIDER_SITE_OTHER): Payer: BLUE CROSS/BLUE SHIELD

## 2018-07-17 ENCOUNTER — Ambulatory Visit (INDEPENDENT_AMBULATORY_CARE_PROVIDER_SITE_OTHER): Payer: BLUE CROSS/BLUE SHIELD | Admitting: Family Medicine

## 2018-07-17 ENCOUNTER — Encounter: Payer: Self-pay | Admitting: Family Medicine

## 2018-07-17 VITALS — BP 123/70 | HR 97 | Temp 99.2°F | Wt 166.0 lb

## 2018-07-17 DIAGNOSIS — R05 Cough: Secondary | ICD-10-CM

## 2018-07-17 DIAGNOSIS — J189 Pneumonia, unspecified organism: Secondary | ICD-10-CM | POA: Diagnosis not present

## 2018-07-17 DIAGNOSIS — R059 Cough, unspecified: Secondary | ICD-10-CM

## 2018-07-17 MED ORDER — BENZONATATE 200 MG PO CAPS: 200.0000 mg | ORAL_CAPSULE | Freq: Three times a day (TID) | ORAL | 1 refills | Status: DC | PRN

## 2018-07-17 MED ORDER — ALBUTEROL SULFATE HFA 108 (90 BASE) MCG/ACT IN AERS: 2.0000 | INHALATION_SPRAY | Freq: Four times a day (QID) | RESPIRATORY_TRACT | 0 refills | Status: DC | PRN

## 2018-07-17 NOTE — Patient Instructions (Addendum)
Thank you for coming in today. Continue over the counter cough medicine.  Robitussin and Muccinex Use sugar free cough drops that have menthol.  Use prescription tessalon pearles as needed.   Use albuterol as needed for wheezing and cough.    Cough, Adult Coughing is a reflex that clears your throat and your airways. Coughing helps to heal and protect your lungs. It is normal to cough occasionally, but a cough that happens with other symptoms or lasts a long time may be a sign of a condition that needs treatment. A cough may last only 2-3 weeks (acute), or it may last longer than 8 weeks (chronic). What are the causes? Coughing is commonly caused by:  Breathing in substances that irritate your lungs.  A viral or bacterial respiratory infection.  Allergies.  Asthma.  Postnasal drip.  Smoking.  Acid backing up from the stomach into the esophagus (gastroesophageal reflux).  Certain medicines.  Chronic lung problems, including COPD (or rarely, lung cancer).  Other medical conditions such as heart failure.  Follow these instructions at home: Pay attention to any changes in your symptoms. Take these actions to help with your discomfort:  Take medicines only as told by your health care provider. ? If you were prescribed an antibiotic medicine, take it as told by your health care provider. Do not stop taking the antibiotic even if you start to feel better. ? Talk with your health care provider before you take a cough suppressant medicine.  Drink enough fluid to keep your urine clear or pale yellow.  If the air is dry, use a cold steam vaporizer or humidifier in your bedroom or your home to help loosen secretions.  Avoid anything that causes you to cough at work or at home.  If your cough is worse at night, try sleeping in a semi-upright position.  Avoid cigarette smoke. If you smoke, quit smoking. If you need help quitting, ask your health care provider.  Avoid  caffeine.  Avoid alcohol.  Rest as needed.  Contact a health care provider if:  You have new symptoms.  You cough up pus.  Your cough does not get better after 2-3 weeks, or your cough gets worse.  You cannot control your cough with suppressant medicines and you are losing sleep.  You develop pain that is getting worse or pain that is not controlled with pain medicines.  You have a fever.  You have unexplained weight loss.  You have night sweats. Get help right away if:  You cough up blood.  You have difficulty breathing.  Your heartbeat is very fast. This information is not intended to replace advice given to you by your health care provider. Make sure you discuss any questions you have with your health care provider. Document Released: 03/16/2011 Document Revised: 02/23/2016 Document Reviewed: 11/24/2014 Elsevier Interactive Patient Education  [employer].

## 2018-07-18 MED ORDER — AZITHROMYCIN 250 MG PO TABS: 250.0000 mg | ORAL_TABLET | Freq: Every day | ORAL | 0 refills | Status: DC

## 2018-07-18 NOTE — Progress Notes (Signed)
Stacey Keith is a 32 y.o. female who presents to [facility] today for cough congestion. Stacey Keith developed fevers chills body aches last week.  This is since resolved and she is been left with a continued bothersome cough.  She notes the cough occurs during the day and at night and does interfere with sleep.  She notes a small amount of wheezing as well but denies significant shortness of breath.  She notes the cough is mildly productive.  She is tried some over-the-counter cough medications which is been mildly helpful.  She denies any personal history of asthma.  No vomiting diarrhea chest pain or palpitations.  Unknown sick contacts.   ROS as above:  Exam:  BP 123/70   Pulse 97   Temp 99.2 F (37.3 C) (Oral)   Wt 166 lb (75.3 kg)   LMP 06/24/2018   SpO2 99%   BMI 31.37 kg/m  Wt Readings from Last 5 Encounters:  07/17/18 166 lb (75.3 kg)  10/10/17 170 lb 9.6 oz (77.4 kg)  05/22/17 179 lb (81.2 kg)  04/23/17 205 lb (93 kg)  04/15/17 202 lb (91.6 kg)    Gen: Well NAD HEENT: EOMI,  MMM normal-appearing posterior pharynx tympanic membranes.  Mildly inflamed nasal turbinates. Lungs: Normal work of breathing.  Crackles present left lung field otherwise normal Heart: RRR no MRG Abd: NABS, Soft. Nondistended, Nontender Exts: Brisk capillary refill, warm and well perfused.   Lab and Radiology Results No results found for this or any previous visit (from the past 72 hour(s)). Dg Chest 2 View  Result Date: 07/18/2018 CLINICAL DATA:  Productive cough EXAM: CHEST - 2 VIEW COMPARISON:  None. FINDINGS: Streaky lingular infiltrate. No pleural effusion. Normal heart size. No pneumothorax. IMPRESSION: Streaky lingular infiltrate. Electronically Signed   By: Jasmine Pang M.D.   On: 07/18/2018 01:13      Assessment and Plan: 32 y.o. female with cough following upper  respiratory illness.  Patient clinically looks well however she did have abnormal lung sounds on exam.  X-ray read was not available at the time of discharge and per my read did not show infiltrate however as of the writing of the note the following morning before clinic radiology over read did show concern for lingular infiltrate.  Will treat symptomatically for cough with Tessalon Perles along with over-the-counter medications.  Reasonable to have a trial of albuterol as well.  Will treat infiltrate with a azithromycin.  Will call in antibiotics inform patient.   Orders Placed This Encounter  Procedures  . DG Chest 2 View    Order Specific Question:   Reason for exam:    Answer:   Cough, assess intra-thoracic pathology    Order Specific Question:   Is the patient pregnant?    Answer:   No    Order Specific Question:   Preferred imaging location?    Answer:   [facility]   Meds ordered this encounter  Medications  . benzonatate (TESSALON) 200 MG capsule    Sig: Take 1 capsule (200 mg total) by mouth 3 (three) times daily as needed for cough.    Dispense:  45 capsule    Refill:  1  . albuterol (PROVENTIL HFA;VENTOLIN HFA) 108 (90 Base) MCG/ACT inhaler    Sig: Inhale 2 puffs into the lungs every 6 (six) hours as needed for wheezing or shortness of breath.    Dispense:  1 Inhaler  Refill:  0     Historical information moved to improve visibility of documentation.  Past Medical History:  Diagnosis Date  . Mastitis 04/2015  . Medical history non-contributory    Past Surgical History:  Procedure Laterality Date  . CESAREAN SECTION N/A 04/07/2015   Procedure: CESAREAN SECTION;  Surgeon: Reva Bores, MD;  Location: [facility];  Service: Obstetrics;  Laterality: N/A;  . CESAREAN SECTION N/A 04/23/2017   Procedure: CESAREAN SECTION;  Surgeon: [name], DO;  Location: [facility] BIRTHING SUITES;  Service: Obstetrics;  Laterality: N/A;  . EAR TUBE REMOVAL Right    20 YEARS AGO  .  UTERINE SEPTUM RESECTION  04/27/2014  . WISDOM TOOTH EXTRACTION     Social History   Tobacco Use  . Smoking status: Never Smoker  . Smokeless tobacco: Never Used  Substance Use Topics  . Alcohol use: No   family history includes Breast cancer in her paternal grandmother; Cancer in her maternal grandfather, maternal grandmother, paternal grandfather, and paternal grandmother; Stroke in her maternal grandmother.  Medications: Current Outpatient Medications  Medication Sig Dispense Refill  . Prenatal Vit-Fe Fumarate-FA (PRENATAL MULTIVITAMIN) TABS tablet Take 1 tablet by mouth daily at 12 noon.    Marland Kitchen albuterol (PROVENTIL HFA;VENTOLIN HFA) 108 (90 Base) MCG/ACT inhaler Inhale 2 puffs into the lungs every 6 (six) hours as needed for wheezing or shortness of breath. 1 Inhaler 0  . benzonatate (TESSALON) 200 MG capsule Take 1 capsule (200 mg total) by mouth 3 (three) times daily as needed for cough. 45 capsule 1   No current facility-administered medications for this visit.    No Known Allergies   Discussed warning signs or symptoms. Please see discharge instructions. Patient expresses understanding.

## 2018-07-31 ENCOUNTER — Ambulatory Visit: Payer: BLUE CROSS/BLUE SHIELD

## 2018-08-05 ENCOUNTER — Ambulatory Visit (INDEPENDENT_AMBULATORY_CARE_PROVIDER_SITE_OTHER): Payer: BLUE CROSS/BLUE SHIELD | Admitting: Osteopathic Medicine

## 2018-08-05 DIAGNOSIS — Z23 Encounter for immunization: Secondary | ICD-10-CM | POA: Diagnosis not present

## 2018-10-16 ENCOUNTER — Encounter: Payer: BLUE CROSS/BLUE SHIELD | Admitting: Osteopathic Medicine

## 2018-11-09 DIAGNOSIS — E782 Mixed hyperlipidemia: Secondary | ICD-10-CM | POA: Insufficient documentation

## 2018-12-18 IMAGING — US US MFM OB DETAIL+14 WK
1 series · 13 of 28 positions shown · non-contrast
Comparison: none

[Series 1: us mfm ob detail+14 wk · 87 acquisitions, 13 frames shown]
[im 4/87]
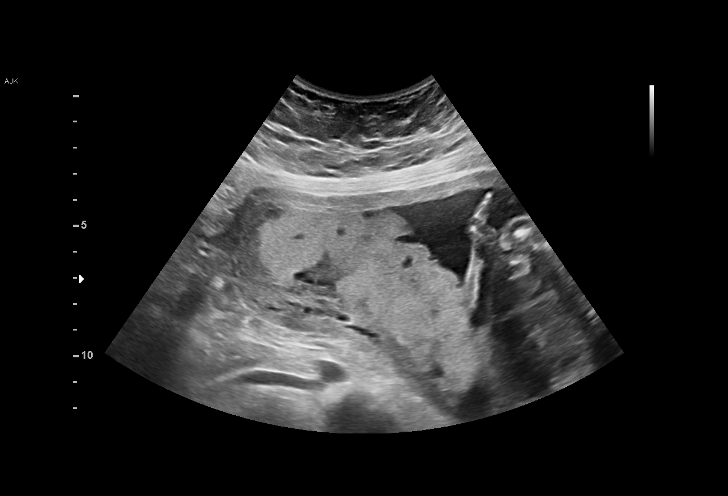
[im 10/87]
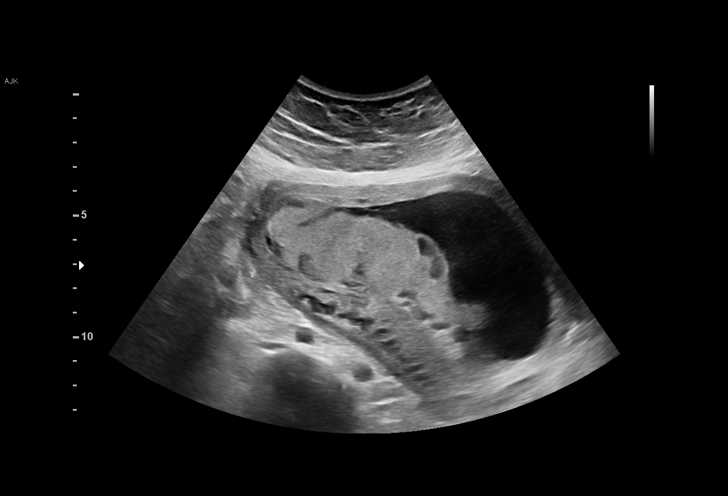
[im 16/87]
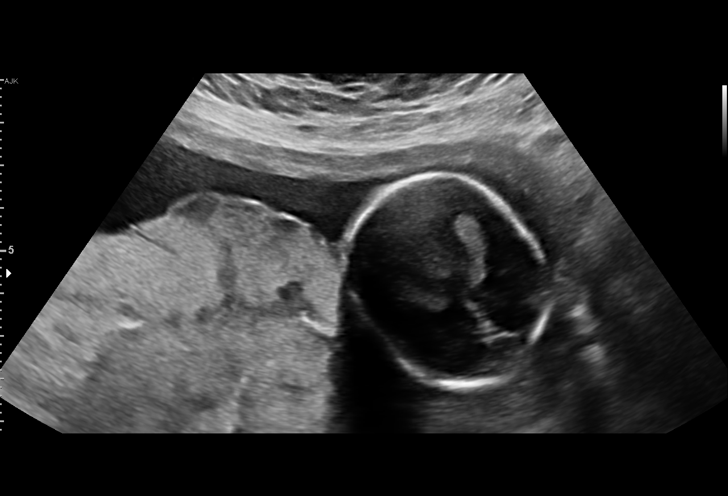
[im 23/87]
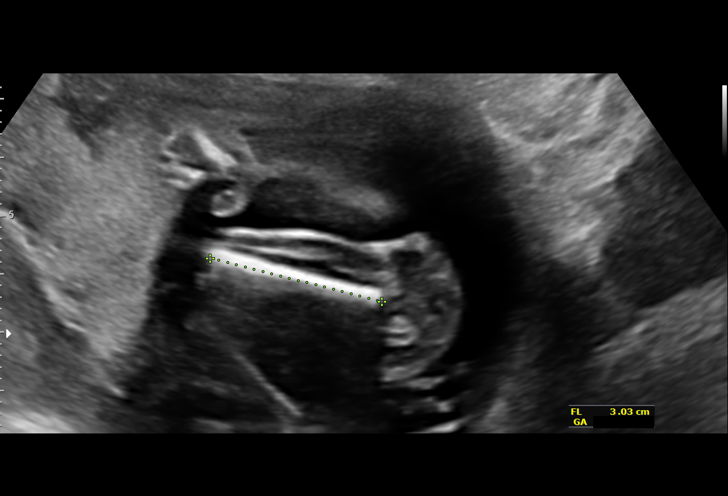
[im 29/87]
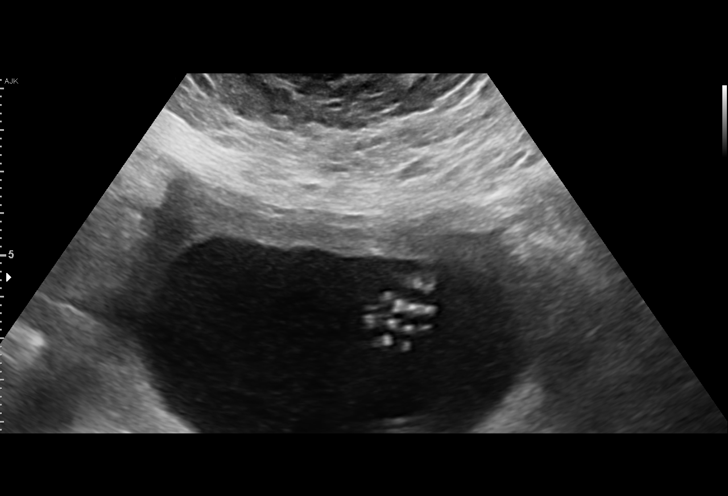
[im 36/87]
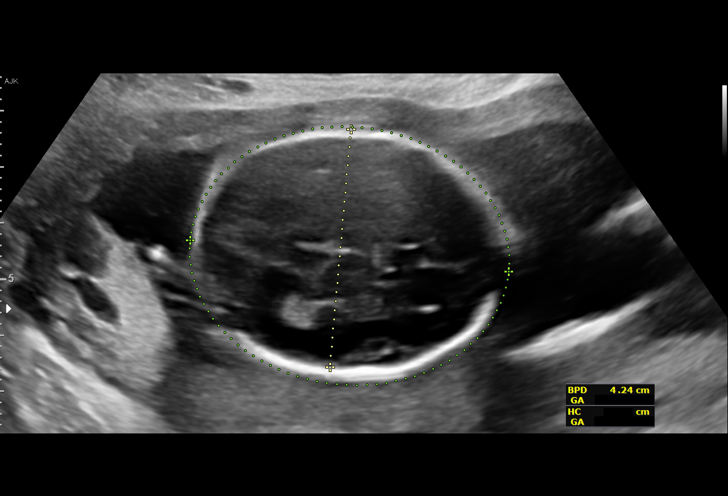
[im 45/87]
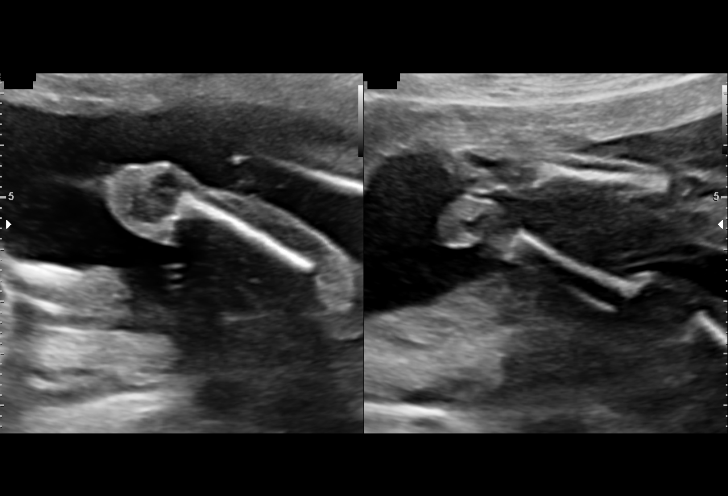
[im 51/87]
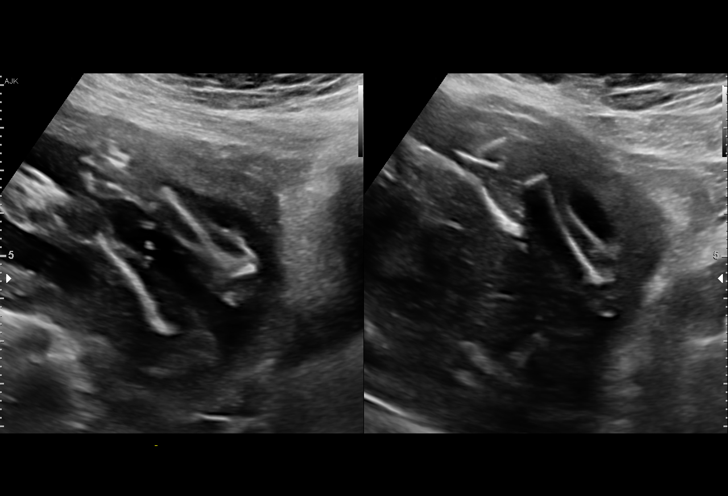
[im 58/87]
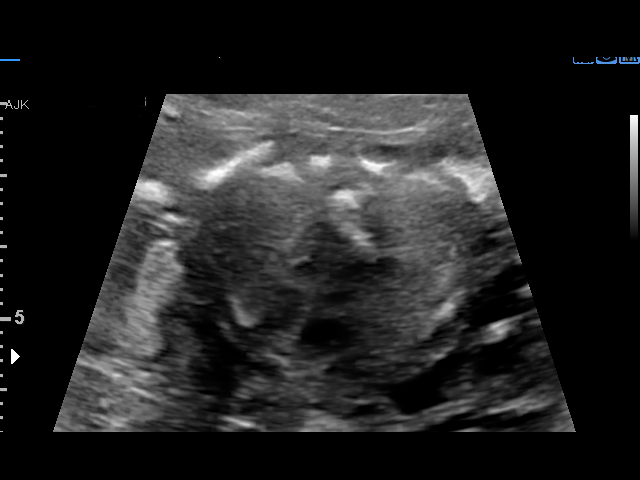
[im 64/87]
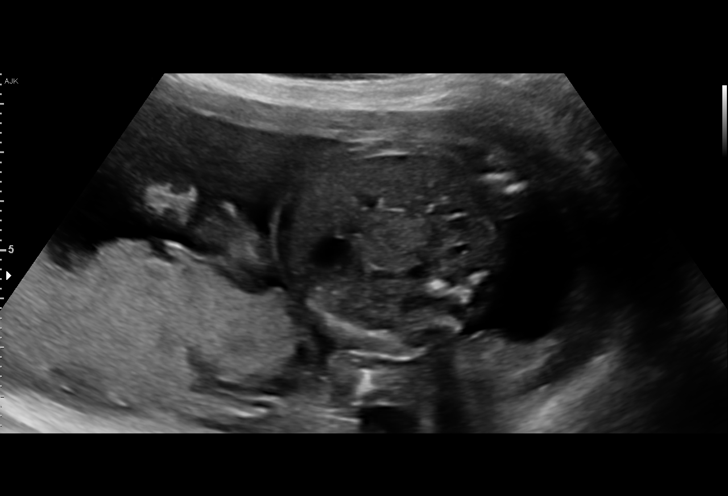
[im 71/87]
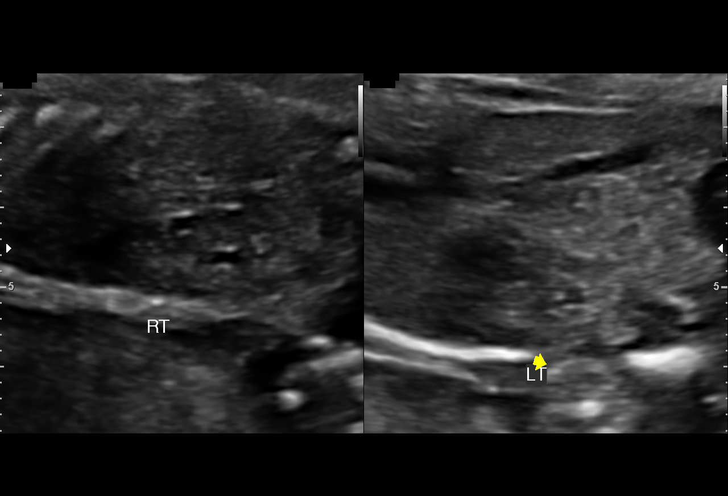
[im 77/87]
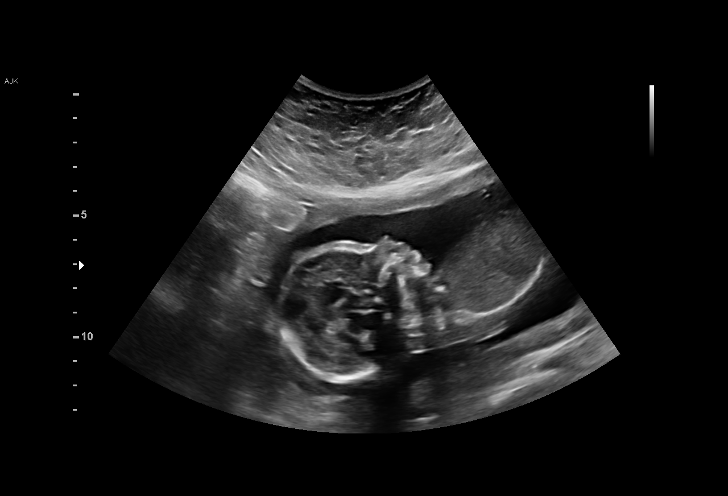
[im 83/87]
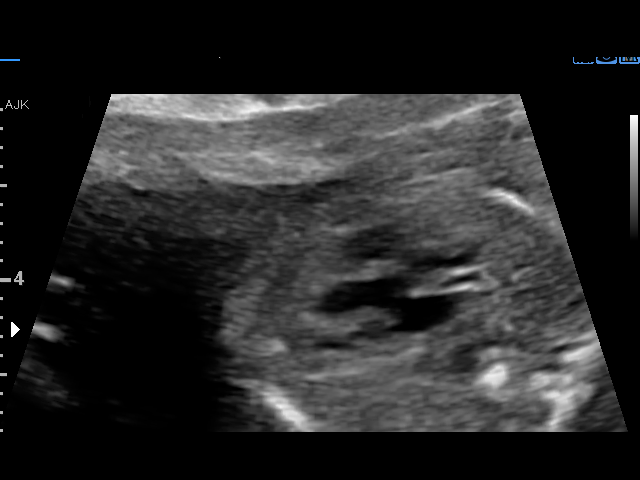

[13 of 28 positions shown; findings below may reference images not displayed]

1  BRIANG HALEVI                [id number]      [id number]     [id number]
Indications

19 weeks gestation of pregnancy
Encounter for fetal anatomic survey
Cystic Fibrosis (CF) Carrier, second trimester
Uterine abnormality during pregnancy
(septate)
History of cesarean delivery, currently
pregnant
Obesity complicating pregnancy, second
trimester
OB History

Blood Type:            Height:  5'2"   Weight (lb):  178      BMI:
Gravidity:    3         Term:   1        Prem:   0        SAB:   1
TOP:          0       Ectopic:  0        Living: 1
Fetal Evaluation

Num Of Fetuses:     1
Fetal Heart         149
Rate(bpm):
Cardiac Activity:   Observed
Presentation:       Breech
Placenta:           Posterior, above cervical os
P. Cord Insertion:  Visualized, central

Amniotic Fluid
AFI FV:      Subjectively within normal limits

Largest Pocket(cm)
5.32
Biometry

BPD:      42.4  mm     G. Age:  18w 6d         31  %    CI:        69.54   %   70 - 86
FL/HC:      19.0   %   16.1 -
HC:      162.3  mm     G. Age:  19w 0d         30  %    HC/AC:      1.12       1.09 -
AC:      144.4  mm     G. Age:  19w 5d         61  %    FL/BPD:     72.9   %
FL:       30.9  mm     G. Age:  19w 4d         53  %    FL/AC:      21.4   %   20 - 24
CER:      20.3  mm     G. Age:  19w 2d         50  %
NFT:       2.9  mm
CM:        4.6  mm

Est. FW:     301  gm    0 lb 11 oz      51  %
Gestational Age

LMP:           19w 2d       Date:   07/24/16                 EDD:   04/30/17
U/S Today:     19w 2d                                        EDD:   04/30/17
Best:          19w 2d    Det. By:   LMP  (07/24/16)          EDD:   04/30/17
Anatomy

Cranium:               Appears normal         Aortic Arch:            Appears normal
Cavum:                 Appears normal         Ductal Arch:            Not well visualized
Ventricles:            Appears normal         Diaphragm:              Appears normal
Choroid Plexus:        Appears normal         Stomach:                Appears normal, left
sided
Cerebellum:            Appears normal         Abdomen:                Appears normal
Posterior Fossa:       Not well visualized    Abdominal Wall:         Appears nml (cord
insert, abd wall)
Nuchal Fold:           Not well visualized    Cord Vessels:           Appears normal (3
vessel cord)
Face:                  Appears normal         Kidneys:                Appear normal
(orbits and profile)
Lips:                  Appears normal         Bladder:                Appears normal
Thoracic:              Appears normal         Spine:                  Not well visualized
Heart:                 Appears normal         Upper Extremities:      Appears normal
(4CH, axis, and situs
RVOT:                  Not well visualized    Lower Extremities:      Appears normal
LVOT:                  Appears normal

Other:  Fetus appears to be a female. Heels visualized. Open hands
visualized. Technically difficult due to maternal habitus and fetal
position.
Cervix Uterus Adnexa

Cervix
Length:            4.2  cm.
Normal appearance by transabdominal scan.

Uterus
No abnormality visualized.

Left Ovary
Not visualized.

Right Ovary
Within normal limits.

Adnexa:       No abnormality visualized.
Impression

Single IUP at 19w 2d
Limited views of the fetal heart, spine and posterior fossa
obtained
The remainder of the fetal anatomy appears normal
Ultrasound measurements are consistent with LMP
Posterior placenta without previa
Normal amniotic fluid volume
Recommendations

Recommend follow up ultrasound in 4 weeks to complete
anatomy
Consider screening cervical lengths due to history of septate
uterus until 24 weeks gestation

## 2019-05-18 LAB — HM PAP SMEAR

## 2019-05-18 LAB — RESULTS CONSOLE HPV: CHL HPV: NEGATIVE

## 2019-11-16 ENCOUNTER — Encounter: Payer: Self-pay | Admitting: Osteopathic Medicine

## 2019-11-16 ENCOUNTER — Ambulatory Visit (INDEPENDENT_AMBULATORY_CARE_PROVIDER_SITE_OTHER): Payer: 59 | Admitting: Osteopathic Medicine

## 2019-11-16 ENCOUNTER — Other Ambulatory Visit: Payer: Self-pay

## 2019-11-16 VITALS — BP 122/81 | HR 81 | Temp 98.4°F | Ht 62.0 in | Wt 184.1 lb

## 2019-11-16 DIAGNOSIS — Z9189 Other specified personal risk factors, not elsewhere classified: Secondary | ICD-10-CM

## 2019-11-16 DIAGNOSIS — D649 Anemia, unspecified: Secondary | ICD-10-CM

## 2019-11-16 DIAGNOSIS — Z0000 Encounter for general adult medical examination without abnormal findings: Secondary | ICD-10-CM

## 2019-11-16 HISTORY — DX: Other specified personal risk factors, not elsewhere classified: Z91.89

## 2019-11-16 NOTE — Patient Instructions (Signed)
General Preventive Care  Most recent routine screening lipids/other labs: ordered!  Everyone should have blood pressure checked once per year.   Tobacco: don't!   Alcohol: responsible moderation is ok for most adults - if you have concerns about your alcohol intake, please talk to me!   Exercise: as tolerated to reduce risk of cardiovascular disease and diabetes. Strength training will also prevent osteoporosis.   Mental health: if need for mental health care (medicines, counseling, other), or concerns about moods, please let me know!   Sexual health: if need for STD testing, or if concerns with libido/pain problems, please let me or OBGYN know! If you need to discuss your birth control options, please let me or OBGYN know!   Advanced Directive: Living Will and/or Healthcare Power of Attorney recommended for all adults, regardless of age or health.  Vaccines  Flu vaccine: recommended for almost everyone, every fall.   Shingles vaccine: Shingrix recommended after age 89.  Pneumonia vaccines: recommended after age 97  Tetanus booster: Tdap recommended every 10 years/3rd TM pregnancy. Due 2028  COVID: recommended as soon as you're eligible! Please follow [email or web address] for updates on eligibility and vaccination appointment. As of now, we do not anticipate our clinic having vaccines anytime soon.  Cancer screenings   Colon cancer screening: recommended for everyone at age 8  Breast cancer screening: mammogram recommended at age 55   Cervical cancer screening: Pap per OBGYN  Lung cancer screening: not needed for non-smokers  Infection screenings . HIV: recommended screening at least once age 72-65, more often as needed. . Gonorrhea/Chlamydia: screening as needed . Hepatitis C: recommended for anyone born 52-1963 . TB: certain at-risk populations, or depending on work requirements and/or travel history Other  Bone Density Test: recommended for women at age 51

## 2019-11-16 NOTE — Progress Notes (Signed)
HPI: Stacey Keith is a 34 y.o. female who  has a past medical history of Mastitis (04/2015), Medical history non-contributory, and Relies on partner vasectomy for contraception (11/16/2019).  she presents to [facility] today, 11/16/19,  for chief complaint of: Annual physica    Patient here for annual physical / wellness exam.  See preventive care reviewed as below.    Additional concerns today include:  Nothing major, some irregularity to her periods, relies on partner vasectomy for contraception     Past medical, surgical, social and family history reviewed:  Patient Active Problem List   Diagnosis Date Noted  . Relies on partner vasectomy for contraception 11/16/2019  . S/P cesarean section 04/23/2017  . Supervision of other normal pregnancy, antepartum 10/04/2016  . Previous cesarean delivery, delivered 10/04/2016  . Obesity in pregnancy 10/04/2016  . 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 Septate uterus 10/12/2014  . Oligomenorrhea 05/23/2013  . Hirsutism 05/23/2013    Past Surgical History:  Procedure Laterality Date  . CESAREAN SECTION N/A 04/07/2015   Procedure: CESAREAN SECTION;  Surgeon: Reva Bores, MD;  Location: [facility];  Service: Obstetrics;  Laterality: N/A;  . CESAREAN SECTION N/A 04/23/2017   Procedure: CESAREAN SECTION;  Surgeon: [name], DO;  Location: [facility] BIRTHING SUITES;  Service: Obstetrics;  Laterality: N/A;  . EAR TUBE REMOVAL Right    20 YEARS AGO  . UTERINE SEPTUM RESECTION  04/27/2014  . WISDOM TOOTH EXTRACTION      Social History   Tobacco Use  . Smoking status: Never Smoker  . Smokeless tobacco: Never Used  Substance Use Topics  . Alcohol use: No    Family History  Problem Relation Age of Onset  . Breast cancer Paternal Grandmother   . Cancer Paternal Grandmother        BREAST  . Cancer Maternal Grandmother        BREAST  . Stroke Maternal Grandmother   .  Cancer Maternal Grandfather        KIDNEY  . Cancer Paternal Grandfather        PROSTATE     Current medication list and allergy/intolerance information reviewed:    Current Outpatient Medications  Medication Sig Dispense Refill  . Prenatal Vit-Fe Fumarate-FA (PRENATAL MULTIVITAMIN) TABS tablet Take 1 tablet by mouth daily at 12 noon.     No current facility-administered medications for this visit.    No Known Allergies    Review of Systems:  Constitutional:  No  fever, no chills, No recent illness  HEENT: No  headache, no vision change, no hearing change  Cardiac: No  chest pain, No  pressure, No palpitations,  Respiratory:  No  shortness of breath.  Gastrointestinal: No  abdominal pain, No  nausea  Musculoskeletal: No new myalgia/arthralgia  Skin: No  Rash, No other wounds/concerning lesions  Genitourinary: No  incontinence, No  abnormal genital bleeding, No abnormal genital discharge  Neurologic: No  weakness, No  dizziness  Psychiatric: No  concerns with depression, No  concerns with anxiety, No sleep problems, No mood problems  Exam:  BP 122/81 (BP Location: Left Arm, Patient Position: Sitting, Cuff Size: Normal)   Pulse 81   Temp 98.4 F (36.9 C) (Oral)   Ht 5\' 2"  (1.575 m)   Wt 184 lb 1.3 oz (83.5 kg)   BMI 33.67 kg/m   Constitutional: VS see above. General Appearance: alert, well-developed, well-nourished, NAD  Eyes: Normal lids and conjunctive,  non-icteric sclera  Ears, Nose, Mouth, Throat: TM normal bilaterally.   Neck: No masses, trachea midline. No thyroid enlargement. No tenderness/mass appreciated. No lymphadenopathy  Respiratory: Normal respiratory effort. no wheeze, no rhonchi, no rales  Cardiovascular: S1/S2 normal, no murmur, no rub/gallop auscultated. RRR. No lower extremity edema.   Gastrointestinal: Nontender, no masses. No hepatomegaly, no splenomegaly. No hernia appreciated. Bowel sounds normal. Rectal exam deferred.    Musculoskeletal: Gait normal. No clubbing/cyanosis of digits.   Neurological: Normal balance/coordination. No tremor. No cranial nerve deficit on limited exam. Motor and sensation intact and symmetric. Cerebellar reflexes intact.   Skin: warm, dry, intact. No rash/ulcer. No concerning nevi or subq nodules on limited exam.    Psychiatric: Normal judgment/insight. Normal mood and affect. Oriented x3.    No results found for this or any previous visit (from the past 72 hour(s)).  No results found.         ASSESSMENT/PLAN: The primary encounter diagnosis was Annual physical exam. A diagnosis of Relies on partner vasectomy for contraception was also pertinent to this visit.    Orders Placed This Encounter  Procedures  . CBC  . COMPLETE METABOLIC PANEL WITH GFR  . Lipid panel    No orders of the defined types were placed in this encounter.   Patient Instructions  General Preventive Care  Most recent routine screening lipids/other labs: ordered!  Everyone should have blood pressure checked once per year.   Tobacco: don't!   Alcohol: responsible moderation is ok for most adults - if you have concerns about your alcohol intake, please talk to me!   Exercise: as tolerated to reduce risk of cardiovascular disease and diabetes. Strength training will also prevent osteoporosis.   Mental health: if need for mental health care (medicines, counseling, other), or concerns about moods, please let me know!   Sexual health: if need for STD testing, or if concerns with libido/pain problems, please let me or OBGYN know! If you need to discuss your birth control options, please let me or OBGYN know!   Advanced Directive: Living Will and/or Healthcare Power of Attorney recommended for all adults, regardless of age or health.  Vaccines  Flu vaccine: recommended for almost everyone, every fall.   Shingles vaccine: Shingrix recommended after age 7.  Pneumonia vaccines: recommended  after age 61  Tetanus booster: Tdap recommended every 10 years/3rd TM pregnancy. Due 2028  COVID: recommended as soon as you're eligible! Please follow [email or web address] for updates on eligibility and vaccination appointment. As of now, we do not anticipate our clinic having vaccines anytime soon.  Cancer screenings   Colon cancer screening: recommended for everyone at age 65  Breast cancer screening: mammogram recommended at age 21   Cervical cancer screening: Pap per OBGYN  Lung cancer screening: not needed for non-smokers  Infection screenings . HIV: recommended screening at least once age 41-65, more often as needed. . Gonorrhea/Chlamydia: screening as needed . Hepatitis C: recommended for anyone born [birth year]-1965 . TB: certain at-risk populations, or depending on work requirements and/or travel history Other  Bone Density Test: recommended for women at age 71       Visit summary with medication list and pertinent instructions was printed for patient to review. All questions at time of visit were answered - patient instructed to contact office with any additional concerns or updates. ER/RTC precautions were reviewed with the patient.    Please note: voice recognition software was used to produce this document, and typos may escape review. Please  contact Dr. Sheppard Coil for any needed clarifications.     Follow-up plan: Return in about 1 year (around 11/15/2020) for [place] (call week prior to visit for lab orders).

## 2019-11-17 LAB — LIPID PANEL
Cholesterol: 230 mg/dL — ABNORMAL HIGH (ref <200)
HDL: 41 mg/dL — ABNORMAL LOW (ref >=50)
LDL Cholesterol (Calc): 156 mg/dL (calc) — ABNORMAL HIGH
Non-HDL Cholesterol (Calc): 189 mg/dL (calc) — ABNORMAL HIGH (ref <130)
Total CHOL/HDL Ratio: 5.6 (calc) — ABNORMAL HIGH (ref <5.0)
Triglycerides: 192 mg/dL — ABNORMAL HIGH (ref <150)

## 2019-11-17 LAB — COMPLETE METABOLIC PANEL WITH GFR
AG Ratio: 1.6 (calc) (ref 1.0–2.5)
ALT: 12 U/L (ref 6–29)
AST: 13 U/L (ref 10–30)
Albumin: 4.4 g/dL (ref 3.6–5.1)
Alkaline phosphatase (APISO): 49 U/L (ref 31–125)
BUN: 14 mg/dL (ref 7–25)
CO2: 27 mmol/L (ref 20–32)
Calcium: 9.5 mg/dL (ref 8.6–10.2)
Chloride: 105 mmol/L (ref 98–110)
Creat: 0.7 mg/dL (ref 0.50–1.10)
GFR, Est African American: 134 mL/min/{1.73_m2} (ref >=60)
GFR, Est Non African American: 115 mL/min/{1.73_m2} (ref >=60)
Globulin: 2.8 g/dL (calc) (ref 1.9–3.7)
Glucose, Bld: 87 mg/dL (ref 65–99)
Potassium: 4.5 mmol/L (ref 3.5–5.3)
Sodium: 139 mmol/L (ref 135–146)
Total Bilirubin: 0.4 mg/dL (ref 0.2–1.2)
Total Protein: 7.2 g/dL (ref 6.1–8.1)

## 2019-11-17 LAB — CBC
HCT: 34.6 % — ABNORMAL LOW (ref 35.0–45.0)
Hemoglobin: 10.8 g/dL — ABNORMAL LOW (ref 11.7–15.5)
MCH: 23.4 pg — ABNORMAL LOW (ref 27.0–33.0)
MCHC: 31.2 g/dL — ABNORMAL LOW (ref 32.0–36.0)
MCV: 75.1 fL — ABNORMAL LOW (ref 80.0–100.0)
MPV: 9.7 fL (ref 7.5–12.5)
Platelets: 373 10*3/uL (ref 140–400)
RBC: 4.61 10*6/uL (ref 3.80–5.10)
RDW: 14 % (ref 11.0–15.0)
WBC: 5.8 10*3/uL (ref 3.8–10.8)

## 2019-11-17 LAB — IRON,TIBC AND FERRITIN PANEL
%SAT: 7 % (calc) — ABNORMAL LOW (ref 16–45)
Ferritin: 5 ng/mL — ABNORMAL LOW (ref 16–154)
Iron: 31 ug/dL — ABNORMAL LOW (ref 40–190)
TIBC: 424 mcg/dL (calc) (ref 250–450)

## 2019-11-17 NOTE — Addendum Note (Signed)
Addended by: Deirdre Pippins on: 11/17/2019 07:10 AM   Modules accepted: Orders

## 2019-11-18 ENCOUNTER — Encounter: Payer: Self-pay | Admitting: Osteopathic Medicine

## 2019-11-20 MED ORDER — FERROUS SULFATE 325 (65 FE) MG PO TBEC: 325.0000 mg | DELAYED_RELEASE_TABLET | Freq: Every day | ORAL | 3 refills | Status: DC

## 2019-12-13 ENCOUNTER — Encounter: Payer: Self-pay | Admitting: Osteopathic Medicine

## 2019-12-14 NOTE — Telephone Encounter (Signed)
Forwarding to provider for recommendation.   

## 2019-12-23 ENCOUNTER — Other Ambulatory Visit: Payer: Self-pay | Admitting: Osteopathic Medicine

## 2019-12-23 MED ORDER — PHENTERMINE HCL 37.5 MG PO TABS: 37.5000 mg | ORAL_TABLET | Freq: Every day | ORAL | 0 refills | Status: DC

## 2019-12-23 NOTE — Progress Notes (Unsigned)
Past Surgical History:  Procedure Laterality Date  . CESAREAN SECTION N/A 04/07/2015   Procedure: CESAREAN SECTION;  Surgeon: Reva Bores, MD;  Location: [facility];  Service: Obstetrics;  Laterality: N/A;  . CESAREAN SECTION N/A 04/23/2017   Procedure: CESAREAN SECTION;  Surgeon: [name], DO;  Location: [facility] BIRTHING SUITES;  Service: Obstetrics;  Laterality: N/A;  . EAR TUBE REMOVAL Right    20 YEARS AGO  . UTERINE SEPTUM RESECTION  04/27/2014  . WISDOM TOOTH EXTRACTION     Patient Active Problem List   Diagnosis Date Noted  . Relies on partner vasectomy for contraception 11/16/2019  . S/P cesarean section 04/23/2017  . Supervision of other normal pregnancy, antepartum 10/04/2016  . Previous cesarean delivery, delivered 10/04/2016  . Obesity in pregnancy 10/04/2016  . 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 Septate uterus 10/12/2014  . Oligomenorrhea 05/23/2013  . Hirsutism 05/23/2013

## 2020-01-22 ENCOUNTER — Other Ambulatory Visit: Payer: Self-pay

## 2020-01-22 ENCOUNTER — Ambulatory Visit (INDEPENDENT_AMBULATORY_CARE_PROVIDER_SITE_OTHER): Payer: 59 | Admitting: Osteopathic Medicine

## 2020-01-22 VITALS — BP 116/76 | HR 82 | Ht 62.0 in | Wt 176.0 lb

## 2020-01-22 DIAGNOSIS — R635 Abnormal weight gain: Secondary | ICD-10-CM | POA: Diagnosis not present

## 2020-01-22 MED ORDER — PHENTERMINE HCL 37.5 MG PO TABS: 37.5000 mg | ORAL_TABLET | Freq: Every day | ORAL | 0 refills | Status: DC

## 2020-01-22 NOTE — Progress Notes (Signed)
Patient is here for blood pressure and weigh check. Denies trouble sleeping, palpitations, or medication problems.   Patient has lost 8 lbs since last visit on 11/16/2019. A refill for phentermine is pended for provider to be sent to the pharmacy. Patient advised to schedule a follow up with nurse in 30 days.

## 2020-07-13 LAB — IRON,TIBC AND FERRITIN PANEL
%SAT: 12 % (calc) — ABNORMAL LOW (ref 16–45)
Ferritin: 10 ng/mL — ABNORMAL LOW (ref 16–154)
Iron: 47 ug/dL (ref 40–190)
TIBC: 408 mcg/dL (calc) (ref 250–450)

## 2020-12-01 ENCOUNTER — Other Ambulatory Visit: Payer: Self-pay | Admitting: Osteopathic Medicine

## 2020-12-15 ENCOUNTER — Other Ambulatory Visit: Payer: Self-pay

## 2020-12-15 ENCOUNTER — Encounter: Payer: Self-pay | Admitting: Osteopathic Medicine

## 2020-12-15 ENCOUNTER — Ambulatory Visit (INDEPENDENT_AMBULATORY_CARE_PROVIDER_SITE_OTHER): Payer: No Typology Code available for payment source | Admitting: Osteopathic Medicine

## 2020-12-15 VITALS — BP 131/86 | HR 87 | Temp 98.3°F | Wt 172.1 lb

## 2020-12-15 DIAGNOSIS — D509 Iron deficiency anemia, unspecified: Secondary | ICD-10-CM | POA: Diagnosis not present

## 2020-12-15 DIAGNOSIS — Z0000 Encounter for general adult medical examination without abnormal findings: Secondary | ICD-10-CM | POA: Diagnosis not present

## 2020-12-15 LAB — LIPID PANEL
Cholesterol: 228 mg/dL — ABNORMAL HIGH (ref <200)
HDL: 43 mg/dL — ABNORMAL LOW (ref >=50)
Non-HDL Cholesterol (Calc): 185 mg/dL (calc) — ABNORMAL HIGH (ref <130)
Triglycerides: 181 mg/dL — ABNORMAL HIGH (ref <150)

## 2020-12-15 LAB — CBC
HCT: 41.1 % (ref 35.0–45.0)
MCH: 27.3 pg (ref 27.0–33.0)
Platelets: 396 10*3/uL (ref 140–400)
RDW: 13.6 % (ref 11.0–15.0)

## 2020-12-15 LAB — IRON,TIBC AND FERRITIN PANEL: TIBC: 384 mcg/dL (calc) (ref 250–450)

## 2020-12-15 LAB — COMPLETE METABOLIC PANEL WITH GFR
AST: 12 U/L (ref 10–30)
BUN: 13 mg/dL (ref 7–25)
Glucose, Bld: 84 mg/dL (ref 65–99)
Total Protein: 7.5 g/dL (ref 6.1–8.1)

## 2020-12-15 MED ORDER — NORELGESTROMIN-ETH ESTRADIOL 150-35 MCG/24HR TD PTWK: 1.0000 | MEDICATED_PATCH | TRANSDERMAL | 4 refills | Status: DC

## 2020-12-15 NOTE — Patient Instructions (Addendum)
General Preventive Care  Most recent routine screening labs: ordered today.   Blood pressure goal 130/80 or less. Rx recommended if >140/90.   Tobacco: don't!   Alcohol: responsible moderation is ok for most adults - if you have concerns about your alcohol intake, please talk to me!   Exercise: as tolerated to reduce risk of cardiovascular disease and diabetes. Strength training will also prevent osteoporosis.   Mental health: if need for mental health care (medicines, counseling, other), or concerns about moods, please let me know!   Sexual / Reproductive health: if need for STD testing, or if concerns with libido/pain problems, please let me or OBGYN know!   Advanced Directive: Living Will and/or Healthcare Power of Attorney recommended for all adults, regardless of age or health.  Vaccines  Flu vaccine: for almost everyone, every fall.   Shingles vaccine: after age 62.   Pneumonia vaccines: after age 58, or sooner if certain medical conditions.  Tetanus booster: every 10 years (due 2028) / 3rd trimester of pregnancy  COVID vaccine: THANKS for getting your vaccine! :)  Cancer screenings   Colon cancer screening: for everyone age 100-75. Colonoscopy available for all, many people also qualify for the Cologuard stool test   Breast cancer screening: mammogram at age 8   Cervical cancer screening: Pap per OBGYN   Lung cancer screening: not needed for non-smokers  Infection screenings  . HIV: recommended screening at least once age 10-65 . Gonorrhea/Chlamydia, other STI: screening as needed . Hepatitis C: recommended once for everyone age 46-75 . TB: at-risk populations, or depending on work requirements and/or travel history Other . Bone Density Test: recommended for women at age 88

## 2020-12-15 NOTE — Progress Notes (Signed)
Stacey Keith is a 35 y.o. female who presents to  [facility]  today, 12/15/20, seeking care for the following:  . Annual physical and labs  . Would like to get on birth control - going through separation from husband, amicable. He had vasectomy so she hasn't needed anything for awhile.      ASSESSMENT & PLAN with other pertinent findings:  The primary encounter diagnosis was Annual physical exam. A diagnosis of Iron deficiency anemia, unspecified iron deficiency anemia type was also pertinent to this visit.    Patient Instructions  General Preventive Care  Most recent routine screening labs: ordered today.   Blood pressure goal 130/80 or less. Rx recommended if >140/90.   Tobacco: don't!   Alcohol: responsible moderation is ok for most adults - if you have concerns about your alcohol intake, please talk to me!   Exercise: as tolerated to reduce risk of cardiovascular disease and diabetes. Strength training will also prevent osteoporosis.   Mental health: if need for mental health care (medicines, counseling, other), or concerns about moods, please let me know!   Sexual / Reproductive health: if need for STD testing, or if concerns with libido/pain problems, please let me or OBGYN know!   Advanced Directive: Living Will and/or Healthcare Power of Attorney recommended for all adults, regardless of age or health.  Vaccines  Flu vaccine: for almost everyone, every fall.   Shingles vaccine: after age 14.   Pneumonia vaccines: after age 48, or sooner if certain medical conditions.  Tetanus booster: every 10 years (due 2028) / 3rd trimester of pregnancy  COVID vaccine: THANKS for getting your vaccine! :)  Cancer screenings   Colon cancer screening: for everyone age 69-75. Colonoscopy available for all, many people also qualify for the Cologuard stool test   Breast cancer screening: mammogram at age 4   Cervical  cancer screening: Pap per OBGYN   Lung cancer screening: not needed for non-smokers  Infection screenings  . HIV: recommended screening at least once age 70-65 . Gonorrhea/Chlamydia, other STI: screening as needed . Hepatitis C: recommended once for everyone age 31-75 . TB: at-risk populations, or depending on work requirements and/or travel history Other . Bone Density Test: recommended for women at age 59   Orders Placed This Encounter  Procedures  . CBC  . COMPLETE METABOLIC PANEL WITH GFR  . Lipid panel  . TSH  . Fe+TIBC+Fer    No orders of the defined types were placed in this encounter.    See below for relevant physical exam findings  See below for recent lab and imaging results reviewed  Medications, allergies, PMH, PSH, SocH, FamH reviewed below    Follow-up instructions: Return in about 1 year (around 12/15/2021) for [place] (call week prior to visit for lab orders).                                        Exam:  BP 131/86 (BP Location: Left Arm, Patient Position: Sitting, Cuff Size: Normal)   Pulse 87   Temp 98.3 F (36.8 C) (Oral)   Wt 172 lb 1.3 oz (78.1 kg)   BMI 31.47 kg/m   Constitutional: VS see above. General Appearance: alert, well-developed, well-nourished, NAD  Neck: No masses, trachea midline.   Respiratory: Normal respiratory effort. no wheeze, no rhonchi, no rales  Cardiovascular: S1/S2 normal,  no murmur, no rub/gallop auscultated. RRR.   Musculoskeletal: Gait normal. Symmetric and independent movement of all extremities  Abdominal: non-tender, non-distended, no appreciable organomegaly, neg Murphy's, BS WNLx4  Neurological: Normal balance/coordination. No tremor.  Skin: warm, dry, intact.   Psychiatric: Normal judgment/insight. Normal mood and affect. Oriented x3.   Wt Readings from Last 3 Encounters:  12/15/20 172 lb 1.3 oz (78.1 kg)  01/22/20 176 lb (79.8 kg)  11/16/19 184 lb 1.3 oz (83.5 kg)    BP Readings from Last 3 Encounters:  12/15/20 131/86  01/22/20 116/76  11/16/19 122/81    . Marland Kitchen  Current Meds  Medication Sig  . ferrous sulfate 325 (65 FE) MG EC tablet TAKE 1 TABLET BY MOUTH EVERY DAY WITH BREAKFAST    No Known Allergies  Patient Active Problem List   Diagnosis Date Noted  . Relies on partner vasectomy for contraception 11/16/2019  . S/P cesarean section 04/23/2017  . Supervision of other normal pregnancy, antepartum 10/04/2016  . Previous cesarean delivery, delivered 10/04/2016  . Obesity in pregnancy 10/04/2016  . 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 Septate uterus 10/12/2014  . Oligomenorrhea 05/23/2013  . Hirsutism 05/23/2013    Family History  Problem Relation Age of Onset  . Breast cancer Paternal Grandmother   . Cancer Paternal Grandmother        BREAST  . Cancer Maternal Grandmother        BREAST  . Stroke Maternal Grandmother   . Cancer Maternal Grandfather        KIDNEY  . Cancer Paternal Grandfather        PROSTATE    Social History   Tobacco Use  Smoking Status Never Smoker  Smokeless Tobacco Never Used    Past Surgical History:  Procedure Laterality Date  . CESAREAN SECTION N/A 04/07/2015   Procedure: CESAREAN SECTION;  Surgeon: Reva Bores, MD;  Location: [facility];  Service: Obstetrics;  Laterality: N/A;  . CESAREAN SECTION N/A 04/23/2017   Procedure: CESAREAN SECTION;  Surgeon: [name], DO;  Location: [facility] BIRTHING SUITES;  Service: Obstetrics;  Laterality: N/A;  . EAR TUBE REMOVAL Right    20 YEARS AGO  . UTERINE SEPTUM RESECTION  04/27/2014  . WISDOM TOOTH EXTRACTION      Immunization History  Administered Date(s) Administered  . Influenza,inj,Quad PF,6+ Mos 10/04/2016, 10/10/2017, 08/05/2018  . Influenza-Unspecified 10/04/2016, 10/10/2017, 08/05/2018, 07/04/2019, 07/08/2020  . Moderna Sars-Covid-2 Vaccination 12/07/2019, 01/04/2020  . Tdap 01/06/2015, 02/01/2017    No  results found for this or any previous visit (from the past 2160 hour(s)).  No results found.     All questions at time of visit were answered - patient instructed to contact office with any additional concerns or updates. ER/RTC precautions were reviewed with the patient as applicable.   Please note: manual typing as well as voice recognition software may have been used to produce this document - typos may escape review. Please contact Dr. Lyn Hollingshead for any needed clarifications.

## 2020-12-16 LAB — COMPLETE METABOLIC PANEL WITH GFR
AG Ratio: 1.5 (calc) (ref 1.0–2.5)
ALT: 15 U/L (ref 6–29)
Albumin: 4.5 g/dL (ref 3.6–5.1)
Alkaline phosphatase (APISO): 50 U/L (ref 31–125)
CO2: 29 mmol/L (ref 20–32)
Calcium: 10.2 mg/dL (ref 8.6–10.2)
Chloride: 101 mmol/L (ref 98–110)
Creat: 0.74 mg/dL (ref 0.50–1.10)
GFR, Est African American: 124 mL/min/{1.73_m2} (ref >=60)
GFR, Est Non African American: 107 mL/min/{1.73_m2} (ref >=60)
Globulin: 3 g/dL (calc) (ref 1.9–3.7)
Potassium: 4.2 mmol/L (ref 3.5–5.3)
Sodium: 139 mmol/L (ref 135–146)
Total Bilirubin: 0.4 mg/dL (ref 0.2–1.2)

## 2020-12-16 LAB — CBC
Hemoglobin: 13.5 g/dL (ref 11.7–15.5)
MCHC: 32.8 g/dL (ref 32.0–36.0)
MCV: 83.2 fL (ref 80.0–100.0)
MPV: 9.5 fL (ref 7.5–12.5)
RBC: 4.94 10*6/uL (ref 3.80–5.10)
WBC: 8.2 10*3/uL (ref 3.8–10.8)

## 2020-12-16 LAB — IRON,TIBC AND FERRITIN PANEL
%SAT: 12 % (calc) — ABNORMAL LOW (ref 16–45)
Ferritin: 22 ng/mL (ref 16–154)
Iron: 47 ug/dL (ref 40–190)

## 2020-12-16 LAB — LIPID PANEL
LDL Cholesterol (Calc): 153 mg/dL (calc) — ABNORMAL HIGH
Total CHOL/HDL Ratio: 5.3 (calc) — ABNORMAL HIGH (ref <5.0)

## 2020-12-16 LAB — TSH: TSH: 1.3 mIU/L

## 2021-01-04 ENCOUNTER — Encounter: Payer: Self-pay | Admitting: Osteopathic Medicine

## 2021-02-21 ENCOUNTER — Encounter: Payer: Self-pay | Admitting: Osteopathic Medicine

## 2021-02-24 ENCOUNTER — Encounter: Payer: Self-pay | Admitting: Osteopathic Medicine

## 2021-02-24 ENCOUNTER — Telehealth (INDEPENDENT_AMBULATORY_CARE_PROVIDER_SITE_OTHER): Payer: No Typology Code available for payment source | Admitting: Osteopathic Medicine

## 2021-02-24 DIAGNOSIS — T887XXA Unspecified adverse effect of drug or medicament, initial encounter: Secondary | ICD-10-CM

## 2021-02-24 DIAGNOSIS — L0291 Cutaneous abscess, unspecified: Secondary | ICD-10-CM

## 2021-02-24 MED ORDER — CEPHALEXIN 500 MG PO CAPS: 500.0000 mg | ORAL_CAPSULE | Freq: Three times a day (TID) | ORAL | 0 refills | Status: AC

## 2021-02-24 NOTE — Progress Notes (Signed)
Telemedicine Visit via  Audio only - telephone (patient preference /  technical difficulty with MyChart video application)  I connected with Stacey Keith on 02/24/21 at 8:24 AM  by phone or  telemedicine application as noted above  I verified that I am speaking with or regarding  the correct patient using two identifiers.  Participants: Myself, Dr Sunnie Nielsen DO Patient: Stacey Keith Patient proxy if applicable: none Other, if applicable: none  Patient is at home I am in office at [facility]    I discussed the limitations of evaluation and management  by telemedicine and the availability of in person appointments.  The participant(s) above expressed understanding and  agreed to proceed with this appointment via telemedicine.       History of Present Illness: Stacey Keith is a 35 y.o. female who would like to discuss    Chief Complaint  Patient presents with  . Abscess   Last week small bump, getting bigger, more sore. Has been using warm compresses. Ruptured and expressed pus and blood. Then another one came back beside it, just came to a head this AM and ruptured this morning   Birth control patches seem to be causing some lightheadeness/mild headache, symptoms not present on off weeks. She denies ision change, unilateral stabbing headache, nausea, photo/phono sensitivity     Observations/Objective: There were no vitals taken for this visit. BP Readings from Last 3 Encounters:  12/15/20 131/86  01/22/20 116/76  11/16/19 122/81   Exam: Normal Speech.  NAD  Lab and Radiology Results No results found for this or any previous visit (from the past 72 hour(s)). No results found.     Assessment and Plan: 35 y.o. female with The primary encounter diagnosis was Abscess - ruptured without intervention and improving . A diagnosis of Side effect of medication - contraceptive patch causing mild headache no red flag symptoms  was also pertinent to  this visit.  1. Abscess - ruptured without intervention and improving  Advised if already ruptured, nothing much else to do but keep clean. If recurrence, take antibiotics and CALL OFFICE   2. Side effect of medication - contraceptive patch causing mild headache no red flag symptoms  Advised can switch methods, ot would like to give it some more time on the patches since symptoms aren't too bothersome, I think this is reasonable but would have low threshold for d/c    PDMP not reviewed this encounter. No orders of the defined types were placed in this encounter.  Meds ordered this encounter  Medications  . cephALEXin (KEFLEX) 500 MG capsule    Sig: Take 1 capsule (500 mg total) by mouth 3 (three) times daily for 7 days.    Dispense:  21 capsule    Refill:  0     Follow Up Instructions: Return if symptoms worsen or fail to improve.    I discussed the assessment and treatment plan with the patient. The patient was provided an opportunity to ask questions and all were answered. The patient agreed with the plan and demonstrated an understanding of the instructions.   The patient was advised to call back or seek an in-person evaluation if any new concerns, if symptoms worsen or if the condition fails to improve as anticipated.  21 minutes of non-face-to-face time was provided during this encounter.      . . . . . . . . . . . . . Marland Kitchen  Historical information moved to improve visibility of documentation.  Past Medical History:  Diagnosis Date  . Mastitis 04/2015  . Medical history non-contributory   . Relies on partner vasectomy for contraception 11/16/2019   Past Surgical History:  Procedure Laterality Date  . CESAREAN SECTION N/A 04/07/2015   Procedure: CESAREAN SECTION;  Surgeon: Reva Bores, MD;  Location: [facility];  Service: Obstetrics;  Laterality: N/A;  . CESAREAN SECTION N/A 04/23/2017   Procedure: CESAREAN SECTION;  Surgeon:  [name], DO;  Location: [facility] BIRTHING SUITES;  Service: Obstetrics;  Laterality: N/A;  . EAR TUBE REMOVAL Right    20 YEARS AGO  . UTERINE SEPTUM RESECTION  04/27/2014  . WISDOM TOOTH EXTRACTION     Social History   Tobacco Use  . Smoking status: Never Smoker  . Smokeless tobacco: Never Used  Substance Use Topics  . Alcohol use: No   family history includes Breast cancer in her paternal grandmother; Cancer in her maternal grandfather, maternal grandmother, paternal grandfather, and paternal grandmother; Stroke in her maternal grandmother.  Medications: Current Outpatient Medications  Medication Sig Dispense Refill  . cephALEXin (KEFLEX) 500 MG capsule Take 1 capsule (500 mg total) by mouth 3 (three) times daily for 7 days. 21 capsule 0  . ferrous sulfate 325 (65 FE) MG EC tablet TAKE 1 TABLET BY MOUTH EVERY DAY WITH BREAKFAST 90 tablet 0  . norelgestromin-ethinyl estradiol (ORTHO EVRA) 150-35 MCG/24HR transdermal patch Place 1 patch onto the skin once a week. 12 patch 4  . phentermine (ADIPEX-P) 37.5 MG tablet Take 1 tablet (37.5 mg total) by mouth daily before breakfast. (Patient not taking: No sig reported) 30 tablet 0   No current facility-administered medications for this visit.   No Known Allergies   If phone visit, billing and coding can please add appropriate modifier if needed

## 2021-02-24 NOTE — Progress Notes (Signed)
Attempted to contact the patient at 805 am. No answer, left a detailed vm msg. Direct call back info provided.

## 2021-02-24 NOTE — Telephone Encounter (Signed)
Se progress note from visit today 02/24/21

## 2021-02-27 ENCOUNTER — Other Ambulatory Visit: Payer: Self-pay | Admitting: Osteopathic Medicine

## 2021-04-06 ENCOUNTER — Ambulatory Visit (INDEPENDENT_AMBULATORY_CARE_PROVIDER_SITE_OTHER): Payer: No Typology Code available for payment source | Admitting: Osteopathic Medicine

## 2021-04-06 ENCOUNTER — Other Ambulatory Visit: Payer: Self-pay

## 2021-04-06 VITALS — BP 125/87 | HR 73 | Temp 98.1°F | Ht 62.0 in | Wt 178.0 lb

## 2021-04-06 DIAGNOSIS — L02419 Cutaneous abscess of limb, unspecified: Secondary | ICD-10-CM | POA: Insufficient documentation

## 2021-04-06 DIAGNOSIS — L732 Hidradenitis suppurativa: Secondary | ICD-10-CM | POA: Insufficient documentation

## 2021-04-06 MED ORDER — CLINDAMYCIN HCL 300 MG PO CAPS: 300.0000 mg | ORAL_CAPSULE | Freq: Three times a day (TID) | ORAL | 0 refills | Status: AC

## 2021-04-06 MED ORDER — CLINDAMYCIN PHOSPHATE 1 % EX GEL: Freq: Two times a day (BID) | CUTANEOUS | 0 refills | Status: DC

## 2021-04-06 NOTE — Progress Notes (Signed)
Stacey Keith is a 35 y.o. female who presents to  [facility] at [facility]  today, 04/06/21, seeking care for the following:  Abscess - antibiotics (keflex) Rx 02/24/21 over virtual visit - see notes. She described an abscess that already ruptured. Abx resulted in abscesses resolved for like a week but then abscesses/boils recurred. On exam, multiple mild abscess like lesions, nonfluctuant  Contraception - concern for head ache/tension w/ patches. Discussed options      ASSESSMENT & PLAN with other pertinent findings:  The primary encounter diagnosis was Axillary abscess on R side only - suspect atypical Hidradenitis Suppuritiva . A diagnosis of Hidradenitis axillaris was also pertinent to this visit.    Patient Instructions  Suspect Hidradenitis Suppuritiva under the arm Clindamycin oral antibiotics for 5 days  Clindamycin gel applied topically to armpit twice daily for the next 1-3 months  Depending on response to treatment, may need to continue the Clindamycin gel, consider Rx (metformin, spironolactone, chronic oral antibiotics), dermatology referral     No orders of the defined types were placed in this encounter.   Meds ordered this encounter  Medications   clindamycin (CLEOCIN) 300 MG capsule    Sig: Take 1 capsule (300 mg total) by mouth 3 (three) times daily for 5 days.    Dispense:  15 capsule    Refill:  0   clindamycin (CLINDAGEL) 1 % gel    Sig: Apply topically 2 (two) times daily.    Dispense:  30 g    Refill:  0     See below for relevant physical exam findings  See below for recent lab and imaging results reviewed  Medications, allergies, PMH, PSH, SocH, FamH reviewed below    Follow-up instructions: Return in about 3 weeks (around 04/27/2021) for NEXPLANON INSERTION AND RECHECK SKIN - see [place] sooner if needed! .                                        Exam:  BP 125/87    Pulse 73   Temp 98.1 F (36.7 C) (Oral)   Ht 5\' 2"  (1.575 m)   Wt 178 lb (80.7 kg)   SpO2 100%   BMI 32.56 kg/m  Constitutional: VS see above. General Appearance: alert, well-developed, well-nourished, NAD Neck: No masses, trachea midline.  Respiratory: Normal respiratory effort. Musculoskeletal: Gait normal. Symmetric and independent movement of all extremities Neurological: Normal balance/coordination. No tremor. Skin: warm, dry, intact. Seeabove  Psychiatric: Normal judgment/insight. Normal mood and affect. Oriented x3.   Current Meds  Medication Sig   clindamycin (CLEOCIN) 300 MG capsule Take 1 capsule (300 mg total) by mouth 3 (three) times daily for 5 days.   clindamycin (CLINDAGEL) 1 % gel Apply topically 2 (two) times daily.   ferrous sulfate 325 (65 FE) MG EC tablet TAKE 1 TABLET BY MOUTH EVERY DAY WITH BREAKFAST   norelgestromin-ethinyl estradiol (ORTHO EVRA) 150-35 MCG/24HR transdermal patch Place 1 patch onto the skin once a week.    No Known Allergies  Patient Active Problem List   Diagnosis Date Noted   Axillary abscess on R side only - suspect atypical Hidradenitis Suppuritiva  04/06/2021   Hidradenitis axillaris 04/06/2021   S/P cesarean section 04/23/2017   Supervision of other normal pregnancy, antepartum 10/04/2016   Previous cesarean delivery, delivered 10/04/2016   Obesity in pregnancy 10/04/2016   Cystic  fibrosis carrier in first trimester, antepartum 10/12/2014   Septate uterus 10/12/2014   Oligomenorrhea 05/23/2013   Hirsutism 05/23/2013    Family History  Problem Relation Age of Onset   Breast cancer Paternal Grandmother    Cancer Paternal Grandmother        BREAST   Cancer Maternal Grandmother        BREAST   Stroke Maternal Grandmother    Cancer Maternal Grandfather        KIDNEY   Cancer Paternal Grandfather        PROSTATE    Social History   Tobacco Use  Smoking Status Never  Smokeless Tobacco Never    Past Surgical History:   Procedure Laterality Date   CESAREAN SECTION N/A 04/07/2015   Procedure: CESAREAN SECTION;  Surgeon: Reva Bores, MD;  Location: [facility];  Service: Obstetrics;  Laterality: N/A;   CESAREAN SECTION N/A 04/23/2017   Procedure: CESAREAN SECTION;  Surgeon: [name], DO;  Location: [facility] SU[facility];  Service: Obstetrics;  Laterality: N/A;   EAR TUBE REMOVAL Right    20 YEARS AGO   UTERINE SEPTUM RESECTION  04/27/2014   WISDOM TOOTH EXTRACTION      Immunization History  Administered Date(s) Administered   Influenza,inj,Quad PF,6+ Mos 10/04/2016, 10/10/2017, 08/05/2018   Influenza-Unspecified 10/04/2016, 10/10/2017, 08/05/2018, 07/04/2019, 07/08/2020   Moderna Sars-Covid-2 Vaccination 12/07/2019, 01/04/2020   Tdap 01/06/2015, 02/01/2017    No results found for this or any previous visit (from the past 2160 hour(s)).  No results found.     All questions at time of visit were answered - patient instructed to contact office with any additional concerns or updates. ER/RTC precautions were reviewed with the patient as applicable.   Please note: manual typing as well as voice recognition software may have been used to produce this document - typos may escape review. Please contact Dr. Lyn Hollingshead for any needed clarifications.

## 2021-04-06 NOTE — Patient Instructions (Addendum)
Suspect Hidradenitis Suppuritiva under the arm Clindamycin oral antibiotics for 5 days  Clindamycin gel applied topically to armpit twice daily for the next 1-3 months  Depending on response to treatment, may need to continue the Clindamycin gel, consider Rx (metformin, spironolactone, chronic oral antibiotics), dermatology referral

## 2021-04-24 ENCOUNTER — Other Ambulatory Visit: Payer: Self-pay

## 2021-04-24 ENCOUNTER — Ambulatory Visit (INDEPENDENT_AMBULATORY_CARE_PROVIDER_SITE_OTHER): Payer: No Typology Code available for payment source | Admitting: Osteopathic Medicine

## 2021-04-24 ENCOUNTER — Encounter: Payer: Self-pay | Admitting: Osteopathic Medicine

## 2021-04-24 VITALS — BP 123/81 | HR 93 | Temp 98.1°F | Wt 176.0 lb

## 2021-04-24 DIAGNOSIS — L732 Hidradenitis suppurativa: Secondary | ICD-10-CM

## 2021-04-24 DIAGNOSIS — Z30017 Encounter for initial prescription of implantable subdermal contraceptive: Secondary | ICD-10-CM | POA: Diagnosis not present

## 2021-04-24 LAB — POCT URINE PREGNANCY: Preg Test, Ur: NEGATIVE

## 2021-04-24 NOTE — Progress Notes (Signed)
Stacey Keith is a 35 y.o. female who presents to  [facility] at [facility]  today, 04/25/21, seeking care for the following:  Nexplanon insertion  Recheck axillary skin -patient reports significant improvement since treatment last visit, clindamycin oral antibiotic sent for 5-day treatment and clindamycin gel sent to apply topically to the armpit area once to twice daily for the next 1 to 3 months     ASSESSMENT & PLAN with other pertinent findings:  The primary encounter diagnosis was Nexplanon insertion. A diagnosis of Hidradenitis axillaris was also pertinent to this visit.   See below for insertion procedure, patient tolerated well, no complications.   Patient Instructions  Etonogestrel Implant What is this medication? ETONOGESTREL (et oh noe JES trel) prevents ovulation and pregnancy. It belongs to a group of medications called contraceptives. This medication is a progestinhormone. This medicine may be used for other purposes; ask your health care provider orpharmacist if you have questions. COMMON BRAND NAME(S): Implanon, Nexplanon What should I tell my care team before I take this medication? They need to know if you have any of these conditions: Abnormal vaginal bleeding Blood vessel disease or blood clots Breast, cervical, endometrial, ovarian, liver, or uterine cancer Diabetes Gallbladder disease Heart disease or recent heart attack High blood pressure High cholesterol or triglycerides Kidney disease Liver disease Migraine headaches Seizures Stroke Tobacco smoker An unusual or allergic reaction to etonogestrel, anesthetics or antiseptics, other medications, foods, dyes, or preservatives Pregnant or trying to get pregnant Breast-feeding How should I use this medication? This device is inserted just under the skin on the inner side of your upper armby your care team. Talk to your care team about the use of this  medication in children. Specialcare may be needed. Overdosage: If you think you have taken too much of this medicine contact apoison control center or emergency room at once. NOTE: This medicine is only for you. Do not share this medicine with others. What if I miss a dose? This does not apply. What may interact with this medication? Do not take this medication with any of the following: Amprenavir Fosamprenavir This medication may also interact with the following: Acitretin Aprepitant Armodafinil Bexarotene Bosentan Carbamazepine Certain medications for fungal infections like fluconazole, ketoconazole, itraconazole and voriconazole Certain medications to treat hepatitis, HIV or AIDS Cyclosporine Felbamate Griseofulvin Lamotrigine Modafinil Oxcarbazepine Phenobarbital Phenytoin Primidone Rifabutin Rifampin Rifapentine St. John's wort Topiramate This list may not describe all possible interactions. Give your health care provider a list of all the medicines, herbs, non-prescription drugs, or dietary supplements you use. Also tell them if you smoke, drink alcohol, or use illegaldrugs. Some items may interact with your medicine. What should I watch for while using this medication? This product does not protect you against HIV infection (AIDS) or othersexually transmitted diseases. You should be able to feel the implant by pressing your fingertips over the skin where it was inserted. Contact your care team if you cannot feel the implant, and use a non-hormonal birth control method (such as condoms) until your care team confirms that the implant is in place. Contact your care team ifyou think that the implant may have broken or become bent while in your arm. You will receive a user card from your care team after the implant is inserted. The card is a record of the location of the implant in your upper arm and whenit should be removed. Keep this card with your health records. What side  effects  may I notice from receiving this medication? Side effects that you should report to your care team as soon as possible: Allergic reactions-skin rash, itching, hives, swelling of the face, lips, tongue, or throat Blood clot-pain, swelling, or warmth in the leg, shortness of breath, chest pain Gallbladder problems-severe stomach pain, nausea, vomiting, fever Increase in blood pressure Liver injury-right upper belly pain, loss of appetite, nausea, light-colored stool, dark yellow or brown urine, yellowing skin or eyes, unusual weakness or fatigue New or worsening migraines or headaches Pain, redness, or irritation at injection site Stroke-sudden numbness or weakness of the face, arm, or leg, trouble speaking, confusion, trouble walking, loss of balance or coordination, dizziness, severe headache, change in vision Unusual vaginal discharge, itching, or odor Worsening mood, feelings of depression Side effects that usually do not require medical attention (report to your careteam if they continue or are bothersome): Breast pain or tenderness Dark patches of skin on the face or other sun-exposed areas Irregular menstrual cycles or spotting Nausea Weight gain This list may not describe all possible side effects. Call your doctor for medical advice about side effects. You may report side effects to FDA at1-800-FDA-1088. Where should I keep my medication? This medication is given in a hospital or clinic and will not be stored at home. NOTE: This sheet is a summary. It may not cover all possible information. If you have questions about this medicine, talk to your doctor, pharmacist, orhealth care provider.  2022 Elsevier/Gold Standard (2020-10-25 09:15:27)  Orders Placed This Encounter  Procedures   POCT urine pregnancy    No orders of the defined types were placed in this encounter.    See below for relevant physical exam findings  See below for recent lab and imaging results  reviewed  Medications, allergies, PMH, PSH, SocH, FamH reviewed below    Follow-up instructions: Return for ANNUAL (call week prior to visit for lab orders) due 11/2021.                                        Exam:  BP 123/81 (BP Location: Left Arm, Patient Position: Sitting, Cuff Size: Normal)   Pulse 93   Temp 98.1 F (36.7 C) (Oral)   Wt 176 lb 0.6 oz (79.9 kg)   BMI 32.20 kg/m  Constitutional: VS see above. General Appearance: alert, well-developed, well-nourished, NAD Neck: No masses, trachea midline.  Respiratory: Normal respiratory effort.  Musculoskeletal: Gait normal. Neurological: Normal balance/coordination. No tremor. Skin: warm, dry, intact.  Psychiatric: Normal judgment/insight. Normal mood and affect. Oriented x3.   Current Meds  Medication Sig   ferrous sulfate 325 (65 FE) MG EC tablet TAKE 1 TABLET BY MOUTH EVERY DAY WITH BREAKFAST    No Known Allergies  Patient Active Problem List   Diagnosis Date Noted   Axillary abscess on R side only - suspect atypical Hidradenitis Suppuritiva  04/06/2021   Hidradenitis axillaris 04/06/2021   S/P cesarean section 04/23/2017   Supervision of other normal pregnancy, antepartum 10/04/2016   Previous cesarean delivery, delivered 10/04/2016   Obesity in pregnancy 10/04/2016   Cystic fibrosis carrier in first trimester, antepartum 10/12/2014   Septate uterus 10/12/2014   Oligomenorrhea 05/23/2013   Hirsutism 05/23/2013    Family History  Problem Relation Age of Onset   Breast cancer Paternal Grandmother    Cancer Paternal Grandmother        BREAST   Cancer  Maternal Grandmother        BREAST   Stroke Maternal Grandmother    Cancer Maternal Grandfather        KIDNEY   Cancer Paternal Grandfather        PROSTATE    Social History   Tobacco Use  Smoking Status Never  Smokeless Tobacco Never    Past Surgical History:  Procedure Laterality Date   CESAREAN SECTION N/A  04/07/2015   Procedure: CESAREAN SECTION;  Surgeon: Reva Bores, MD;  Location: [facility];  Service: Obstetrics;  Laterality: N/A;   CESAREAN SECTION N/A 04/23/2017   Procedure: CESAREAN SECTION;  Surgeon: [name], DO;  Location: [facility] BIRTHING SUITES;  Service: Obstetrics;  Laterality: N/A;   EAR TUBE REMOVAL Right    20 YEARS AGO   UTERINE SEPTUM RESECTION  04/27/2014   WISDOM TOOTH EXTRACTION      Immunization History  Administered Date(s) Administered   Influenza,inj,Quad PF,6+ Mos 10/04/2016, 10/10/2017, 08/05/2018   Influenza-Unspecified 10/04/2016, 10/10/2017, 08/05/2018, 07/04/2019, 07/08/2020   Moderna Sars-Covid-2 Vaccination 12/07/2019, 01/04/2020   Tdap 01/06/2015, 02/01/2017    Recent Results (from the past 2160 hour(s))  POCT urine pregnancy     Status: None   Collection Time: 04/24/21  3:27 PM  Result Value Ref Range   Preg Test, Ur Negative Negative    No results found.     All questions at time of visit were answered - patient instructed to contact office with any additional concerns or updates. ER/RTC precautions were reviewed with the patient as applicable.   Please note: manual typing as well as voice recognition software may have been used to produce this document - typos may escape review. Please contact Dr. Lyn Hollingshead for any needed clarifications.           NEXPLANON INSERTION PRE-OP DIAGNOSIS: desired long-term, reversible contraception  POST-OP DIAGNOSIS: Same  PROCEDURE: Nexplanon  placement Performing Physician: Dr. Sunnie Nielsen  Risks and benefits reviewed with the patient. Informed consent obtained, patient opts to proceed today.    PROCEDURE:  Site (check): right arm  Lot #  Sterile Preparation: Chlorhexidine   Insertion site was selected 8 - 10 cm from medial epicondyle and marked along with guiding site using sterile marker  Procedure area was prepped and draped in a sterile fashion. 3 mL of 1% lidocaine without  epinephrine used for subcutaneous anesthesia. Anesthesia confirmed.  Nexplanon  trocar was inserted subcutaneously and then Nexplanon  capsule delivered subcutaneously Trocar was removed from the insertion site. Nexplanon  capsule was palpated by provider and patient to assure satisfactory placement. Estimated blood loss <1 mL Dressings applied: Steri-Strip and small pressure bandage Followup: The patient tolerated the procedure well without complications.  Standard post-procedure care is explained and return precautions are given.  CPT Codes 41638 Insertion, non-biodegradable drug delivery implant.

## 2021-04-24 NOTE — Patient Instructions (Signed)
Etonogestrel Implant What is this medication? ETONOGESTREL (et oh noe JES trel) prevents ovulation and pregnancy. It belongs to a group of medications called contraceptives. This medication is a progestin hormone. This medicine may be used for other purposes; ask your health care provider or pharmacist if you have questions. COMMON BRAND NAME(S): Implanon, Nexplanon What should I tell my care team before I take this medication? They need to know if you have any of these conditions: Abnormal vaginal bleeding Blood vessel disease or blood clots Breast, cervical, endometrial, ovarian, liver, or uterine cancer Diabetes Gallbladder disease Heart disease or recent heart attack High blood pressure High cholesterol or triglycerides Kidney disease Liver disease Migraine headaches Seizures Stroke Tobacco smoker An unusual or allergic reaction to etonogestrel, anesthetics or antiseptics, other medications, foods, dyes, or preservatives Pregnant or trying to get pregnant Breast-feeding How should I use this medication? This device is inserted just under the skin on the inner side of your upper arm by your care team. Talk to your care team about the use of this medication in children. Special care may be needed. Overdosage: If you think you have taken too much of this medicine contact a poison control center or emergency room at once. NOTE: This medicine is only for you. Do not share this medicine with others. What if I miss a dose? This does not apply. What may interact with this medication? Do not take this medication with any of the following: Amprenavir Fosamprenavir This medication may also interact with the following: Acitretin Aprepitant Armodafinil Bexarotene Bosentan Carbamazepine Certain medications for fungal infections like fluconazole, ketoconazole, itraconazole and voriconazole Certain medications to treat hepatitis, HIV or  AIDS Cyclosporine Felbamate Griseofulvin Lamotrigine Modafinil Oxcarbazepine Phenobarbital Phenytoin Primidone Rifabutin Rifampin Rifapentine St. John's wort Topiramate This list may not describe all possible interactions. Give your health care provider a list of all the medicines, herbs, non-prescription drugs, or dietary supplements you use. Also tell them if you smoke, drink alcohol, or use illegal drugs. Some items may interact with your medicine. What should I watch for while using this medication? This product does not protect you against HIV infection (AIDS) or other sexually transmitted diseases. You should be able to feel the implant by pressing your fingertips over the skin where it was inserted. Contact your care team if you cannot feel the implant, and use a non-hormonal birth control method (such as condoms) until your care team confirms that the implant is in place. Contact your care team if you think that the implant may have broken or become bent while in your arm. You will receive a user card from your care team after the implant is inserted. The card is a record of the location of the implant in your upper arm and when it should be removed. Keep this card with your health records. What side effects may I notice from receiving this medication? Side effects that you should report to your care team as soon as possible: Allergic reactions-skin rash, itching, hives, swelling of the face, lips, tongue, or throat Blood clot-pain, swelling, or warmth in the leg, shortness of breath, chest pain Gallbladder problems-severe stomach pain, nausea, vomiting, fever Increase in blood pressure Liver injury-right upper belly pain, loss of appetite, nausea, light-colored stool, dark yellow or brown urine, yellowing skin or eyes, unusual weakness or fatigue New or worsening migraines or headaches Pain, redness, or irritation at injection site Stroke-sudden numbness or weakness of the face,  arm, or leg, trouble speaking, confusion, trouble walking, loss   vision Unusual vaginal discharge, itching, or odor Worsening mood, feelings of depression Side effects that usually do not require medical attention (report to your careteam if they continue or are bothersome): Breast pain or tenderness Dark patches of skin on the face or other sun-exposed areas Irregular menstrual cycles or spotting Nausea Weight gain This list may not describe all possible side effects. Call your doctor for medical advice about side effects. You may report side effects to FDA at1-800-FDA-1088. Where should I keep my medication? This medication is given in a hospital or clinic and will not be stored at home. NOTE: This sheet is a summary. It may not cover all possible information. If you have questions about this medicine, talk to your doctor, pharmacist, orhealth care provider.  2022 Elsevier/Gold Standard (2020-10-25 09:15:27)

## 2021-05-25 ENCOUNTER — Other Ambulatory Visit: Payer: Self-pay | Admitting: Osteopathic Medicine

## 2021-06-18 ENCOUNTER — Other Ambulatory Visit: Payer: Self-pay | Admitting: Osteopathic Medicine

## 2021-06-18 ENCOUNTER — Encounter: Payer: Self-pay | Admitting: Osteopathic Medicine

## 2021-06-19 MED ORDER — CLINDAMYCIN PHOSPHATE 1 % EX GEL: Freq: Two times a day (BID) | CUTANEOUS | 0 refills | Status: DC

## 2021-11-27 ENCOUNTER — Encounter (HOSPITAL_BASED_OUTPATIENT_CLINIC_OR_DEPARTMENT_OTHER): Payer: Self-pay | Admitting: Nurse Practitioner

## 2021-11-27 ENCOUNTER — Other Ambulatory Visit: Payer: Self-pay

## 2021-11-27 ENCOUNTER — Ambulatory Visit (INDEPENDENT_AMBULATORY_CARE_PROVIDER_SITE_OTHER): Payer: No Typology Code available for payment source | Admitting: Nurse Practitioner

## 2021-11-27 VITALS — BP 117/72 | HR 90 | Ht 62.0 in | Wt 187.2 lb

## 2021-11-27 DIAGNOSIS — R5383 Other fatigue: Secondary | ICD-10-CM

## 2021-11-27 DIAGNOSIS — Z23 Encounter for immunization: Secondary | ICD-10-CM

## 2021-11-27 DIAGNOSIS — Z0000 Encounter for general adult medical examination without abnormal findings: Secondary | ICD-10-CM

## 2021-11-27 DIAGNOSIS — D509 Iron deficiency anemia, unspecified: Secondary | ICD-10-CM | POA: Diagnosis not present

## 2021-11-27 NOTE — Assessment & Plan Note (Signed)
Recommend every other day dosing of ferrous sulfate 325mg  to help keep iron levels normal.  Will plan to check labs with CPE after March 17.  Orders placed today- patient may have these done prior to the visit if she would like.

## 2021-11-27 NOTE — Progress Notes (Signed)
Orma Render, DNP, AGNP-c Primary Care & Sports Medicine [address]   [employer] [place], [place] [address] [phone]  New patient visit   Patient: Stacey Keith   DOB: July 26, 1986   36 y.o. Female  MRN: [id number] Visit Date: 11/27/2021  Patient Care Team: Yitzchak Kothari, Coralee Pesa, NP as PCP - General (Nurse Practitioner)  Today's healthcare provider: Orma Render, NP   Chief Complaint  Patient presents with   New Patient (Initial Visit)    Patient presents today to establish care, Nothing other to discuss. She would like her flu while she is in the office.    Subjective    Stacey Keith is a 36 y.o. female who presents today as a new patient to establish care.    Patient endorses the following concerns presently: In need of flu vaccine.  Previously seen by Dr. Sheppard Coil- no chronic health issues presently.  Last CPE in March 2021.  History of anemia with intermittent use of ferrous sulfate.  No shortness of breath or weakness. Some intermittent fatigue endorsed.   History reviewed and reveals the following: Past Medical History:  Diagnosis Date   Mastitis 04/2015   Medical history non-contributory    Relies on partner vasectomy for contraception 11/16/2019   Past Surgical History:  Procedure Laterality Date   CESAREAN SECTION N/A 04/07/2015   Procedure: CESAREAN SECTION;  Surgeon: Donnamae Jude, MD;  Location: [facility];  Service: Obstetrics;  Laterality: N/A;   CESAREAN SECTION N/A 04/23/2017   Procedure: CESAREAN SECTION;  Surgeon: Truett Mainland, DO;  Location: [facility];  Service: Obstetrics;  Laterality: N/A;   EAR TUBE REMOVAL Right    20 YEARS AGO   UTERINE SEPTUM RESECTION  04/27/2014   WISDOM TOOTH EXTRACTION     Family Status  Relation Name Status   PGM  (Not Specified)   MGM  (Not Specified)   MGF  (Not Specified)   PGF  (Not Specified)   Family History  Problem Relation Age of Onset   Breast cancer Paternal Grandmother     Cancer Paternal Grandmother        BREAST   Cancer Maternal Grandmother        BREAST   Stroke Maternal Grandmother    Cancer Maternal Grandfather        KIDNEY   Cancer Paternal Grandfather        PROSTATE   Social History   Socioeconomic History   Marital status: Married    Spouse name: Not on file   Number of children: Not on file   Years of education: Not on file   Highest education level: Not on file  Occupational History   Not on file  Tobacco Use   Smoking status: Never   Smokeless tobacco: Never  Substance and Sexual Activity   Alcohol use: No   Drug use: No   Sexual activity: Yes    Birth control/protection: None  Other Topics Concern   Not on file  Social History Narrative   Not on file   Social Determinants of Health   Financial Resource Strain: Not on file  Food Insecurity: Not on file  Transportation Needs: Not on file  Physical Activity: Not on file  Stress: Not on file  Social Connections: Not on file   Outpatient Medications Prior to Visit  Medication Sig   ferrous sulfate 325 (65 FE) MG EC tablet TAKE 1 TABLET BY MOUTH EVERY DAY WITH BREAKFAST   [DISCONTINUED]  clindamycin (CLINDAGEL) 1 % gel Apply topically 2 (two) times daily.   [DISCONTINUED] norelgestromin-ethinyl estradiol (ORTHO EVRA) 150-35 MCG/24HR transdermal patch Place 1 patch onto the skin once a week. (Patient not taking: Reported on 04/24/2021)   No facility-administered medications prior to visit.   No Known Allergies Immunization History  Administered Date(s) Administered   Influenza,inj,Quad PF,6+ Mos 10/04/2016, 10/10/2017, 08/05/2018, 11/27/2021   Influenza-Unspecified 10/04/2016, 10/10/2017, 08/05/2018, 07/04/2019, 07/08/2020   Moderna Sars-Covid-2 Vaccination 12/07/2019, 01/04/2020   Tdap 01/06/2015, 02/01/2017    Health Maintenance Due: Health Maintenance  Topic Date Due   COVID-19 Vaccine (3 - Booster for Moderna series) 12/13/2021 (Originally 02/29/2020)   PAP  SMEAR-Modifier  05/17/2022   TETANUS/TDAP  02/02/2027   INFLUENZA VACCINE  Completed   HIV Screening  Completed   HPV VACCINES  Aged Out   Hepatitis C Screening  Discontinued    Review of Systems All review of systems negative except what is listed in the HPI   Objective    BP 117/72    Pulse 90    Ht 5\' 2"  (1.575 m)    Wt 187 lb 3.2 oz (84.9 kg)    SpO2 99%    BMI 34.24 kg/m  Physical Exam Vitals and nursing note reviewed.  Constitutional:      General: She is not in acute distress.    Appearance: Normal appearance.  Eyes:     Extraocular Movements: Extraocular movements intact.     Conjunctiva/sclera: Conjunctivae normal.     Pupils: Pupils are equal, round, and reactive to light.  Neck:     Vascular: No carotid bruit.  Cardiovascular:     Rate and Rhythm: Normal rate and regular rhythm.     Pulses: Normal pulses.     Heart sounds: Normal heart sounds. No murmur heard. Pulmonary:     Effort: Pulmonary effort is normal.     Breath sounds: Normal breath sounds. No wheezing.  Abdominal:     General: Bowel sounds are normal.     Palpations: Abdomen is soft.  Musculoskeletal:        General: Normal range of motion.     Cervical back: Normal range of motion.     Right lower leg: No edema.     Left lower leg: No edema.  Skin:    General: Skin is warm and dry.     Capillary Refill: Capillary refill takes less than 2 seconds.  Neurological:     General: No focal deficit present.     Mental Status: She is alert and oriented to person, place, and time.  Psychiatric:        Mood and Affect: Mood normal.        Behavior: Behavior normal.        Thought Content: Thought content normal.        Judgment: Judgment normal.    Results for orders placed or performed in visit on 11/27/21  HM PAP SMEAR  Result Value Ref Range   HM Pap smear nilm   Results Console HPV  Result Value Ref Range   CHL HPV Negative     Assessment & Plan      Problem List Items Addressed This  Visit     Encounter for medical examination to establish care    Review of current and past medical history, social history, medication, and family history.  Review of care gaps and health maintenance recommendations.  Records from recent providers to be requested if not available in Chart  Review or Care Everywhere.  Recommendations for health maintenance, diet, and exercise provided.  Labs today: Orders for future CPE HM Recommendations: None CPE due: After March 17       Relevant Orders   Flu Vaccine QUAD 6+ mos PF IM (Fluarix Quad PF) (Completed)   CBC with Differential/Platelet   Comprehensive metabolic panel   Lipid panel   Hemoglobin A1c   TSH   VITAMIN D 25 Hydroxy (Vit-D Deficiency, Fractures)   Iron, TIBC and Ferritin Panel   Iron deficiency anemia - Primary    Recommend every other day dosing of ferrous sulfate 325mg  to help keep iron levels normal.  Will plan to check labs with CPE after March 17.  Orders placed today- patient may have these done prior to the visit if she would like.       Relevant Orders   CBC with Differential/Platelet   Comprehensive metabolic panel   Lipid panel   Hemoglobin A1c   TSH   VITAMIN D 25 Hydroxy (Vit-D Deficiency, Fractures)   Iron, TIBC and Ferritin Panel   Other Visit Diagnoses     Flu vaccine need       Relevant Orders   Flu Vaccine QUAD 6+ mos PF IM (Fluarix Quad PF) (Completed)   Fatigue, unspecified type       Relevant Orders   CBC with Differential/Platelet   Comprehensive metabolic panel   Lipid panel   Hemoglobin A1c   TSH   VITAMIN D 25 Hydroxy (Vit-D Deficiency, Fractures)   Iron, TIBC and Ferritin Panel        Return for After March 17th for CPE and labs- lab orders in.     Zarius Furr, Coralee Pesa, NP, DNP, AGNP-C Primary Care & Sports Medicine at [facility]

## 2021-11-27 NOTE — Assessment & Plan Note (Signed)
Review of current and past medical history, social history, medication, and family history.  Review of care gaps and health maintenance recommendations.  Records from recent providers to be requested if not available in Chart Review or Care Everywhere.  Recommendations for health maintenance, diet, and exercise provided.  Labs today: Orders for future CPE HM Recommendations: None CPE due: After March 17

## 2021-11-27 NOTE — Patient Instructions (Signed)
Thank you for choosing [facility] for your Primary Care needs. I am excited for the opportunity to partner with you to meet your health care goals. It was a pleasure meeting you today!  Recommendations from today's visit: We will plan to have you come back for your labs and physical exam after March 17. I will put the lab orders in now so that you can do those before the visit, if you would like, it isn't necessary if it causes you to miss work.  I recommend taking the Iron 3 days a week for good results. We will monitor this with your labs to make sure it is normal.  If you should have any questions or concerns between now and your next visit please do not hesitate to reach out.   Information on diet, exercise, and health maintenance recommendations are listed below. This is information to help you be sure you are on track for optimal health and monitoring.   Please look over this and let us know if you have any questions or if you have completed any of the health maintenance outside of [facility] so that we can be sure your records are up to date.  ___________________________________________________________ About Me: I am an Adult-Geriatric Nurse Practitioner with a background in caring for patients for more than 20 years with a strong intensive care background. I provide primary care and sports medicine services to patients age 34 and older within this office. My education had a strong focus on caring for the older adult population, which I am passionate about. I am also the director of the APP Fellowship with [facility].   My desire is to provide you with the best service through preventive medicine and supportive care. I consider you a part of the medical team and value your input. I work diligently to ensure that you are heard and your needs are met in a safe and effective manner. I want you to feel comfortable with me as your provider and want you to know  that your health concerns are important to me.  For your information, our office hours are: Monday, Tuesday, and Thursday 8:00 AM - 5:00 PM Wednesday and Friday 8:00 AM - 12:00 PM.   In my time away from the office I am teaching new APP's within the system and am unavailable, but my partner, Dr. Burnard Bunting is in the office for emergent needs.   If you have questions or concerns, please call our office at [phone] or send [place] a MyChart message and we will respond as quickly as possible.  ____________________________________________________________ MyChart:  For all urgent or time sensitive needs we ask that you please call the office to avoid delays. Our number is [phone]. MyChart is not constantly monitored and due to the large volume of messages a day, replies may take up to 72 business hours.  MyChart Policy: MyChart allows for you to see your visit notes, after visit summary, provider recommendations, lab and tests results, make an appointment, request refills, and contact your provider or the office for non-urgent questions or concerns. Providers are seeing patients during normal business hours and do not have built in time to review MyChart messages.  We ask that you allow a minimum of 3 business days for responses to [employer]. For this reason, please do not send urgent requests through [place]. Please call the office at [phone]. New and ongoing conditions may require a visit. We have virtual and in  person visit available for your convenience.  Complex MyChart concerns may require a visit. Your provider may request you schedule a virtual or in person visit to ensure we are providing the best care possible. MyChart messages sent after 11:00 AM on Friday will not be received by the provider until Monday morning.    Lab and Test Results: You will receive your lab and test results on MyChart as soon as they are completed and results have been sent by the lab or testing  facility. Due to this service, you will receive your results BEFORE your provider.  I review lab and tests results each morning prior to seeing patients. Some results require collaboration with other providers to ensure you are receiving the most appropriate care. For this reason, we ask that you please allow a minimum of 3-5 business days from the time the ALL results have been received for your provider to receive and review lab and test results and contact you about these.  Most lab and test result comments from the provider will be sent through [facility]. Your provider may recommend changes to the plan of care, follow-up visits, repeat testing, ask questions, or request an office visit to discuss these results. You may reply directly to this message or call the office at [phone] to provide information for the provider or set up an appointment. In some instances, you will be called with test results and recommendations. Please let us know if this is preferred and we will make note of this in your chart to provide this for you.    If you have not heard a response to your lab or test results in 5 business days from all results returning to [facility], please call the office to let us know. We ask that you please avoid calling prior to this time unless there is an emergent concern. Due to high call volumes, this can delay the resulting process.  After Hours: For all non-emergency after hours needs, please call the office at [phone] and select the option to reach the on-call provider service. On-call services are shared between multiple [place] offices and therefore it will not be possible to speak directly with your provider. On-call providers may provide medical advice and recommendations, but are unable to provide refills for maintenance medications.  For all emergency or urgent medical needs after normal business hours, we recommend that you seek care at the closest Urgent Care or Emergency  Department to ensure appropriate treatment in a timely manner.  [facility] [place] has a 24 hour emergency room located on the ground floor for your convenience.   Urgent Concerns During the Business Day Providers are seeing patients from 8AM to [place] with a busy schedule and are most often not able to respond to non-urgent calls until the end of the day or the next business day. If you should have URGENT concerns during the day, please call and speak to the nurse or schedule a same day appointment so that we can address your concern without delay.   Thank you, again, for choosing me as your health care partner. I appreciate your trust and look forward to learning more about you.   Worthy Keeler, DNP, AGNP-c ___________________________________________________________  Health Maintenance Recommendations Screening Testing Mammogram Every 1 -2 years based on history and risk factors Starting at age 61 Pap Smear Ages 21-39 every 3 years Ages 89-65 every 5 years with HPV testing More frequent testing may be required based on results and history  Colon Cancer Screening Every 1-10 years based on test performed, risk factors, and history Starting at age 19 Bone Density Screening Every 2-10 years based on history Starting at age 46 for women Recommendations for men differ based on medication usage, history, and risk factors AAA Screening One time ultrasound Men 71-53 years old who have every smoked Lung Cancer Screening Low Dose Lung CT every 12 months Age 59-80 years with a 30 pack-year smoking history who still smoke or who have quit within the last 15 years  Screening Labs Routine  Labs: Complete Blood Count (CBC), Complete Metabolic Panel (CMP), Cholesterol (Lipid Panel) Every 6-12 months based on history and medications May be recommended more frequently based on current conditions or previous results Hemoglobin A1c Lab Every 3-12 months based on history and previous  results Starting at age 42 or earlier with diagnosis of diabetes, high cholesterol, BMI >26, and/or risk factors Frequent monitoring for patients with diabetes to ensure blood sugar control Thyroid Panel (TSH w/ T3 & T4) Every 6 months based on history, symptoms, and risk factors May be repeated more often if on medication HIV One time testing for all patients 42 and older May be repeated more frequently for patients with increased risk factors or exposure Hepatitis C One time testing for all patients 39 and older May be repeated more frequently for patients with increased risk factors or exposure Gonorrhea, Chlamydia Every 12 months for all sexually active persons 13-24 years Additional monitoring may be recommended for those who are considered high risk or who have symptoms PSA Men 54-29 years old with risk factors Additional screening may be recommended from age 46-69 based on risk factors, symptoms, and history  Vaccine Recommendations Tetanus Booster All adults every 10 years Flu Vaccine All patients 6 months and older every year COVID Vaccine All patients 12 years and older Initial dosing with booster May recommend additional booster based on age and health history HPV Vaccine 2 doses all patients age 61-26 Dosing may be considered for patients over 26 Shingles Vaccine (Shingrix) 2 doses all adults 55 years and older Pneumonia (Pneumovax 23) All adults 23 years and older May recommend earlier dosing based on health history Pneumonia (Prevnar 60) All adults 74 years and older Dosed 1 year after Pneumovax 23  Additional Screening, Testing, and Vaccinations may be recommended on an individualized basis based on family history, health history, risk factors, and/or exposure.  __________________________________________________________  Diet Recommendations for All Patients  I recommend that all patients maintain a diet low in saturated fats, carbohydrates, and cholesterol.  While this can be challenging at first, it is not impossible and small changes can make big differences.  Things to try: Decreasing the amount of soda, sweet tea, and/or juice to one or less per day and replace with water While water is always the first choice, if you do not like water you may consider adding a water additive without sugar to improve the taste other sugar free drinks Replace potatoes with a brightly colored vegetable at dinner Use healthy oils, such as canola oil or olive oil, instead of butter or hard margarine Limit your bread intake to two pieces or less a day Replace regular pasta with low carb pasta options Bake, broil, or grill foods instead of frying Monitor portion sizes  Eat smaller, more frequent meals throughout the day instead of large meals  An important thing to remember is, if you love foods that are not great for your health, you don't have to  give them up completely. Instead, allow these foods to be a reward when you have done well. Allowing yourself to still have special treats every once in a while is a nice way to tell yourself thank you for working hard to keep yourself healthy.   Also remember that every day is a new day. If you have a bad day and "fall off the wagon", you can still climb right back up and keep moving along on your journey!  We have resources available to help you!  Some websites that may be helpful include: www.http://carter.biz/  Www.VeryWellFit.com _____________________________________________________________  Activity Recommendations for All Patients  I recommend that all adults get at least 20 minutes of moderate physical activity that elevates your heart rate at least 5 days out of the week.  Some examples include: Walking or jogging at a pace that allows you to carry on a conversation Cycling (stationary bike or outdoors) Water aerobics Yoga Weight lifting Dancing If physical limitations prevent you from putting stress on your  joints, exercise in a pool or seated in a chair are excellent options.  Do determine your MAXIMUM heart rate for activity: YOUR AGE - 220 = MAX HeartRate   Remember! Do not push yourself too hard.  Start slowly and build up your pace, speed, weight, time in exercise, etc.  Allow your body to rest between exercise and get good sleep. You will need more water than normal when you are exerting yourself. Do not wait until you are thirsty to drink. Drink with a purpose of getting in at least 8, 8 ounce glasses of water a day plus more depending on how much you exercise and sweat.    If you begin to develop dizziness, chest pain, abdominal pain, jaw pain, shortness of breath, headache, vision changes, lightheadedness, or other concerning symptoms, stop the activity and allow your body to rest. If your symptoms are severe, seek emergency evaluation immediately. If your symptoms are concerning, but not severe, please let us know so that we can recommend further evaluation.

## 2022-01-10 NOTE — Progress Notes (Signed)
? ?BP 117/72   Pulse 86   Temp 98.7 ?F (37.1 ?C)   Ht 5' 2"  (1.575 m)   Wt 188 lb 3.2 oz (85.4 kg)   SpO2 97%   BMI 34.42 kg/m?   ? ?Subjective:  ? ? Patient ID: Stacey Keith, female    DOB: [birth year], 36 y.o.   MRN: [id number] ? ?HPI: ?Stacey Keith is a 36 y.o. female presenting on 01/11/2022 for comprehensive medical examination.  ? ?Current medical concerns include: Fatigue and anemia hx- labs today ?Allergies ? ?She reports regular vision exams q1-5y: yes ?She reports regular dental exams q 74m yes ?Her diet consists of:  mix of healthy/unhealthy- working on diet and exercise ?She endorses exercise and/or activity of:  moderation ? ?She denies ETOH use  ?She denies nictoine use  ?She denies illegal substance use  ? ?She is currently sexually active and denies concerns today about STI ? ?She denies concerns about skin changes today  ?She denies concerns about bowel changes today  ?She denies concerns about bladder changes today  ? ?Most Recent Depression Screen:  ? ?  01/11/2022  ? 10:07 AM 11/27/2021  ?  1:27 PM 12/15/2020  ?  9:19 AM 11/16/2019  ?  9:19 AM 10/10/2017  ?  7:57 AM  ?Depression screen PHQ 2/9  ?Decreased Interest 0 0 0 0 0  ?Down, Depressed, Hopeless 0 0 0 1 0  ?PHQ - 2 Score 0 0 0 1 0  ?Altered sleeping    1 0  ?Tired, decreased energy    1 1  ?Change in appetite    1 0  ?Feeling bad or failure about yourself     0 0  ?Trouble concentrating    0 0  ?Moving slowly or fidgety/restless    0 0  ?Suicidal thoughts    0 0  ?PHQ-9 Score    4 1  ?Difficult doing work/chores    Not difficult at all Not difficult at all  ? ?Most Recent Anxiety Screen:  ? ?  11/16/2019  ?  9:19 AM 10/10/2017  ?  7:57 AM  ?GAD 7 : Generalized Anxiety Score  ?Nervous, Anxious, on Edge 1 0  ?Control/stop worrying 0 0  ?Worry too much - different things 1 0  ?Trouble relaxing 0 0  ?Restless 0 0  ?Easily annoyed or irritable 1 0  ?Afraid - awful might happen 0 0  ?Total GAD 7 Score 3 0  ?Anxiety Difficulty Not difficult at all    ? ?Most Recent Fall Screen: ? ?  01/11/2022  ? 10:07 AM 11/27/2021  ?  1:27 PM 12/15/2020  ?  9:19 AM 11/16/2019  ?  9:19 AM  ?Fall Risk   ?Falls in the past year? 0 0 0 0  ?Number falls in past yr: 0 0    ?Injury with Fall? 0 0    ?Risk for fall due to : No Fall Risks No Fall Risks    ?Follow up Falls evaluation completed;Education provided Falls evaluation completed    ? ? ?All ROS negative except what is listed above and in the HPI.  ? ?Past medical history, surgical history, medications, allergies, family history and social history reviewed with patient today and changes made to appropriate areas of the chart.  ?Past Medical History:  ?Past Medical History:  ?Diagnosis Date  ? Mastitis 04/2015  ? Medical history non-contributory   ? Relies on partner vasectomy for contraception 11/16/2019  ? ?Medications:  ?Current Outpatient Medications  on File Prior to Visit  ?Medication Sig  ? ferrous sulfate 325 (65 FE) MG EC tablet TAKE 1 TABLET BY MOUTH EVERY DAY WITH BREAKFAST  ? ?No current facility-administered medications on file prior to visit.  ? ?Surgical History:  ?Past Surgical History:  ?Procedure Laterality Date  ? CESAREAN SECTION N/A 04/07/2015  ? Procedure: CESAREAN SECTION;  Surgeon: Donnamae Jude, MD;  Location: [facility];  Service: Obstetrics;  Laterality: N/A;  ? CESAREAN SECTION N/A 04/23/2017  ? Procedure: CESAREAN SECTION;  Surgeon: Truett Mainland, DO;  Location: [facility];  Service: Obstetrics;  Laterality: N/A;  ? EAR TUBE REMOVAL Right   ? 20 YEARS AGO  ? UTERINE SEPTUM RESECTION  04/27/2014  ? WISDOM TOOTH EXTRACTION    ? ?Allergies:  ?No Known Allergies ?Social History:  ?Social History  ? ?Socioeconomic History  ? Marital status: Married  ?  Spouse name: Not on file  ? Number of children: Not on file  ? Years of education: Not on file  ? Highest education level: Not on file  ?Occupational History  ? Not on file  ?Tobacco Use  ? Smoking status: Never  ? Smokeless tobacco: Never  ?Substance and  Sexual Activity  ? Alcohol use: No  ? Drug use: No  ? Sexual activity: Yes  ?  Birth control/protection: None  ?Other Topics Concern  ? Not on file  ?Social History Narrative  ? Not on file  ? ?Social Determinants of Health  ? ?Financial Resource Strain: Not on file  ?Food Insecurity: Not on file  ?Transportation Needs: Not on file  ?Physical Activity: Not on file  ?Stress: Not on file  ?Social Connections: Not on file  ?Intimate Partner Violence: Not on file  ? ?Social History  ? ?Tobacco Use  ?Smoking Status Never  ?Smokeless Tobacco Never  ? ?Social History  ? ?Substance and Sexual Activity  ?Alcohol Use No  ? ?Family History:  ?Family History  ?Problem Relation Age of Onset  ? Breast cancer Paternal Grandmother   ? Cancer Paternal Grandmother   ?     BREAST  ? Cancer Maternal Grandmother   ?     BREAST  ? Stroke Maternal Grandmother   ? Cancer Maternal Grandfather   ?     KIDNEY  ? Cancer Paternal Grandfather   ?     PROSTATE  ? ? ?   ?Objective:  ?  ?BP 117/72   Pulse 86   Temp 98.7 ?F (37.1 ?C)   Ht 5' 2"  (1.575 m)   Wt 188 lb 3.2 oz (85.4 kg)   SpO2 97%   BMI 34.42 kg/m?   ?Wt Readings from Last 3 Encounters:  ?01/11/22 188 lb 3.2 oz (85.4 kg)  ?11/27/21 187 lb 3.2 oz (84.9 kg)  ?04/24/21 176 lb 0.6 oz (79.9 kg)  ?  ?Physical Exam ?Vitals and nursing note reviewed.  ?Constitutional:   ?   Appearance: Normal appearance.  ?HENT:  ?   Head: Normocephalic.  ?   Right Ear: Tympanic membrane normal.  ?   Left Ear: Tympanic membrane normal.  ?   Nose: Congestion present.  ?   Mouth/Throat:  ?   Mouth: Mucous membranes are moist.  ?   Pharynx: Oropharynx is clear. No posterior oropharyngeal erythema.  ?Eyes:  ?   Extraocular Movements: Extraocular movements intact.  ?   Conjunctiva/sclera: Conjunctivae normal.  ?   Pupils: Pupils are equal, round, and reactive to light.  ?Neck:  ?  Vascular: No carotid bruit.  ?Cardiovascular:  ?   Rate and Rhythm: Normal rate and regular rhythm.  ?   Pulses: Normal pulses.  ?    Heart sounds: Normal heart sounds.  ?Pulmonary:  ?   Effort: Pulmonary effort is normal.  ?   Breath sounds: Normal breath sounds.  ?Abdominal:  ?   General: Bowel sounds are normal.  ?   Palpations: Abdomen is soft.  ?Musculoskeletal:     ?   General: Normal range of motion.  ?   Cervical back: Normal range of motion. No tenderness.  ?   Right lower leg: No edema.  ?   Left lower leg: No edema.  ?Lymphadenopathy:  ?   Cervical: No cervical adenopathy.  ?Skin: ?   General: Skin is warm and dry.  ?   Capillary Refill: Capillary refill takes less than 2 seconds.  ?Neurological:  ?   General: No focal deficit present.  ?   Mental Status: She is alert and oriented to person, place, and time.  ?   Cranial Nerves: No cranial nerve deficit.  ?   Motor: No weakness.  ?   Gait: Gait normal.  ?Psychiatric:     ?   Mood and Affect: Mood normal.     ?   Behavior: Behavior normal.     ?   Thought Content: Thought content normal.     ?   Judgment: Judgment normal.  ? ? ?Results for orders placed or performed in visit on 01/11/22  ?Iron, TIBC and Ferritin Panel  ?Result Value Ref Range  ? Total Iron Binding Capacity 294 250 - 450 ug/dL  ? UIBC 242 131 - 425 ug/dL  ? Iron 52 27 - 159 ug/dL  ? Iron Saturation 18 15 - 55 %  ? Ferritin 86 15 - 150 ng/mL  ?VITAMIN D 25 Hydroxy (Vit-D Deficiency, Fractures)  ?Result Value Ref Range  ? Vit D, 25-Hydroxy 27.9 (L) 30.0 - 100.0 ng/mL  ?TSH  ?Result Value Ref Range  ? TSH 0.958 0.450 - 4.500 uIU/mL  ?Hemoglobin A1c  ?Result Value Ref Range  ? Hgb A1c MFr Bld 5.5 4.8 - 5.6 %  ? Est. average glucose Bld gHb Est-mCnc 111 mg/dL  ?Lipid panel  ?Result Value Ref Range  ? Cholesterol, Total 219 (H) 100 - 199 mg/dL  ? Triglycerides 227 (H) 0 - 149 mg/dL  ? HDL 33 (L) >39 mg/dL  ? VLDL Cholesterol Cal 41 (H) 5 - 40 mg/dL  ? LDL Chol Calc (NIH) 145 (H) 0 - 99 mg/dL  ? Chol/HDL Ratio 6.6 (H) 0.0 - 4.4 ratio  ?Comprehensive metabolic panel  ?Result Value Ref Range  ? Glucose 82 70 - 99 mg/dL  ? BUN 12  6 - 20 mg/dL  ? Creatinine, Ser 0.76 0.57 - 1.00 mg/dL  ? eGFR 106 >59 mL/min/1.73  ? BUN/Creatinine Ratio 16 9 - 23  ? Sodium 142 134 - 144 mmol/L  ? Potassium 4.5 3.5 - 5.2 mmol/L  ? Chloride 105 96 - 106 mmol/L

## 2022-01-11 ENCOUNTER — Ambulatory Visit (INDEPENDENT_AMBULATORY_CARE_PROVIDER_SITE_OTHER): Payer: No Typology Code available for payment source | Admitting: Nurse Practitioner

## 2022-01-11 ENCOUNTER — Encounter (HOSPITAL_BASED_OUTPATIENT_CLINIC_OR_DEPARTMENT_OTHER): Payer: Self-pay | Admitting: Nurse Practitioner

## 2022-01-11 ENCOUNTER — Other Ambulatory Visit (HOSPITAL_BASED_OUTPATIENT_CLINIC_OR_DEPARTMENT_OTHER): Payer: Self-pay

## 2022-01-11 VITALS — BP 117/72 | HR 86 | Temp 98.7°F | Ht 62.0 in | Wt 188.2 lb

## 2022-01-11 DIAGNOSIS — E782 Mixed hyperlipidemia: Secondary | ICD-10-CM | POA: Diagnosis not present

## 2022-01-11 DIAGNOSIS — Z0000 Encounter for general adult medical examination without abnormal findings: Secondary | ICD-10-CM

## 2022-01-11 DIAGNOSIS — R5383 Other fatigue: Secondary | ICD-10-CM | POA: Diagnosis not present

## 2022-01-11 DIAGNOSIS — D509 Iron deficiency anemia, unspecified: Secondary | ICD-10-CM

## 2022-01-11 NOTE — Patient Instructions (Signed)
It was a pleasure seeing you today. I hope your time spent with [place] was pleasant and helpful. Please let us know if there is anything we can do to improve the service you receive.  ? ?Today we discussed concerns with: ? ?Encounter for annual physical exam ?I recommend taking Xyzal for your allergies are not controlled with the Zyrtec.  ?We will get labs today and make sure everything looks ok. I will call you with the results.  ? ? ?Important Office Information ?Lab Results ?If labs were ordered, please note that you will see results through MyChart as soon as they come available from [facility].  ?It takes up to 5 business days for the results to be routed to me and for me to review them once all of the lab results have come through from [facility]. I will make recommendations based on your results and send these through MyChart or someone from the office will call you to discuss. If your labs are abnormal, we may contact you to schedule a visit to discuss the results and make recommendations.  ?If you have not heard from [place] within 5 business days or you have waited longer than a week and your lab results have not come through on MyChart, please feel free to call the office or send a message through MyChart to follow-up on these labs.  ? ?Referrals ?If referrals were placed today, the office where the referral was sent will contact you either by phone or through MyChart to set up scheduling. Please note that it can take up to a week for the referral office to contact you. If you do not hear from them in a week, please contact the referral office directly to inquire about scheduling.  ? ?Condition Treated ?If your condition worsens or you begin to have new symptoms, please schedule a follow-up appointment for further evaluation. If you are not sure if an appointment is needed, you may call the office to leave a message for the nurse and someone will contact you with recommendations.  ?If you have an urgent or life  threatening emergency, please do not call the office, but seek emergency evaluation by calling 911 or going to the nearest emergency room for evaluation.  ? ?MyChart and Phone Calls ?Please do not use MyChart for urgent messages. It may take up to 3 business days for MyChart messages to be read by staff and if they are unable to handle the request, an additional 3 business days for them to be routed to me and for my response.  ?Messages sent to the provider through MyChart do not come directly to the provider, please allow time for these messages to be routed and for me to respond.  ?We get a large volume of MyChart messages daily and these are responded to in the order received.  ? ?For urgent messages, please call the office at [phone] and speak with the front office staff or leave a message on the line of my assistant for guidance.  ?We are seeing patients from the hours of 8:00 am through 5:00 pm and calls directly to the nurse may not be answered immediately due to seeing patients, but your call will be returned as soon as possible.  ?Phone  messages received after 4:00 PM Monday through Thursday may not be returned until the following business day. Phone messages received after 11:00 AM on Friday may not be returned until Monday.  ? ?After Hours ?We share on call hours with providers  from other offices. If you have an urgent need after hours that cannot wait until the next business day, please contact the on call provider by calling the office number. A nurse will speak with you and contact the provider if needed for recommendations.  ?If you have an urgent or life threatening emergency after hours, please do not call the on call provider, but seek emergency evaluation by calling 911 or going to the nearest emergency room for evaluation.  ? ?Paperwork ?All paperwork requires a minimum of 5 days to complete and return to you or the designated personnel. Please keep this in mind when bringing in forms or  sending requests for paperwork completion to the office.  ?  ?

## 2022-01-12 LAB — LIPID PANEL
Chol/HDL Ratio: 6.6 ratio — ABNORMAL HIGH (ref 0.0–4.4)
Cholesterol, Total: 219 mg/dL — ABNORMAL HIGH (ref 100–199)
HDL: 33 mg/dL — ABNORMAL LOW (ref >39)
LDL Chol Calc (NIH): 145 mg/dL — ABNORMAL HIGH (ref 0–99)
Triglycerides: 227 mg/dL — ABNORMAL HIGH (ref 0–149)
VLDL Cholesterol Cal: 41 mg/dL — ABNORMAL HIGH (ref 5–40)

## 2022-01-12 LAB — COMPREHENSIVE METABOLIC PANEL
ALT: 34 IU/L — ABNORMAL HIGH (ref 0–32)
AST: 26 IU/L (ref 0–40)
Albumin/Globulin Ratio: 1.6 (ref 1.2–2.2)
Albumin: 4.5 g/dL (ref 3.8–4.8)
Alkaline Phosphatase: 55 IU/L (ref 44–121)
BUN/Creatinine Ratio: 16 (ref 9–23)
BUN: 12 mg/dL (ref 6–20)
Bilirubin Total: 0.4 mg/dL (ref 0.0–1.2)
CO2: 22 mmol/L (ref 20–29)
Calcium: 10 mg/dL (ref 8.7–10.2)
Chloride: 105 mmol/L (ref 96–106)
Creatinine, Ser: 0.76 mg/dL (ref 0.57–1.00)
Globulin, Total: 2.8 g/dL (ref 1.5–4.5)
Glucose: 82 mg/dL (ref 70–99)
Potassium: 4.5 mmol/L (ref 3.5–5.2)
Sodium: 142 mmol/L (ref 134–144)
Total Protein: 7.3 g/dL (ref 6.0–8.5)
eGFR: 106 mL/min/{1.73_m2} (ref >59)

## 2022-01-12 LAB — CBC WITH DIFFERENTIAL/PLATELET
Basophils Absolute: 0 10*3/uL (ref 0.0–0.2)
Basos: 0 %
EOS (ABSOLUTE): 0.1 10*3/uL (ref 0.0–0.4)
Eos: 1 %
Hematocrit: 41 % (ref 34.0–46.6)
Hemoglobin: 13.2 g/dL (ref 11.1–15.9)
Immature Grans (Abs): 0 10*3/uL (ref 0.0–0.1)
Immature Granulocytes: 0 %
Lymphocytes Absolute: 3.2 10*3/uL — ABNORMAL HIGH (ref 0.7–3.1)
Lymphs: 46 %
MCH: 26.8 pg (ref 26.6–33.0)
MCHC: 32.2 g/dL (ref 31.5–35.7)
MCV: 83 fL (ref 79–97)
Monocytes Absolute: 0.4 10*3/uL (ref 0.1–0.9)
Monocytes: 5 %
Neutrophils Absolute: 3.3 10*3/uL (ref 1.4–7.0)
Neutrophils: 48 %
Platelets: 361 10*3/uL (ref 150–450)
RBC: 4.92 x10E6/uL (ref 3.77–5.28)
RDW: 13.8 % (ref 11.7–15.4)
WBC: 6.9 10*3/uL (ref 3.4–10.8)

## 2022-01-12 LAB — IRON,TIBC AND FERRITIN PANEL
Ferritin: 86 ng/mL (ref 15–150)
Iron Saturation: 18 % (ref 15–55)
Iron: 52 ug/dL (ref 27–159)
Total Iron Binding Capacity: 294 ug/dL (ref 250–450)
UIBC: 242 ug/dL (ref 131–425)

## 2022-01-12 LAB — VITAMIN D 25 HYDROXY (VIT D DEFICIENCY, FRACTURES): Vit D, 25-Hydroxy: 27.9 ng/mL — ABNORMAL LOW (ref 30.0–100.0)

## 2022-01-12 LAB — HEMOGLOBIN A1C
Est. average glucose Bld gHb Est-mCnc: 111 mg/dL
Hgb A1c MFr Bld: 5.5 % (ref 4.8–5.6)

## 2022-01-12 LAB — TSH: TSH: 0.958 u[IU]/mL (ref 0.450–4.500)

## 2022-01-17 ENCOUNTER — Encounter (HOSPITAL_BASED_OUTPATIENT_CLINIC_OR_DEPARTMENT_OTHER): Payer: Self-pay | Admitting: Nurse Practitioner

## 2022-01-17 ENCOUNTER — Other Ambulatory Visit (HOSPITAL_BASED_OUTPATIENT_CLINIC_OR_DEPARTMENT_OTHER): Payer: Self-pay | Admitting: Nurse Practitioner

## 2022-01-17 DIAGNOSIS — E559 Vitamin D deficiency, unspecified: Secondary | ICD-10-CM

## 2022-01-17 DIAGNOSIS — E782 Mixed hyperlipidemia: Secondary | ICD-10-CM

## 2022-01-17 MED ORDER — VITAMIN D (ERGOCALCIFEROL) 1.25 MG (50000 UNIT) PO CAPS: 50000.0000 [IU] | ORAL_CAPSULE | ORAL | 0 refills | Status: DC

## 2022-02-08 DIAGNOSIS — R5383 Other fatigue: Secondary | ICD-10-CM | POA: Insufficient documentation

## 2022-02-08 NOTE — Assessment & Plan Note (Signed)
Working on diet and exercise for improvement. Labs today.  ?

## 2022-02-08 NOTE — Assessment & Plan Note (Signed)
Labs today for further evaluation. 

## 2022-08-26 ENCOUNTER — Other Ambulatory Visit (HOSPITAL_BASED_OUTPATIENT_CLINIC_OR_DEPARTMENT_OTHER): Payer: Self-pay | Admitting: Nurse Practitioner

## 2022-08-26 DIAGNOSIS — E559 Vitamin D deficiency, unspecified: Secondary | ICD-10-CM

## 2022-12-10 ENCOUNTER — Other Ambulatory Visit: Payer: Self-pay

## 2022-12-10 ENCOUNTER — Encounter: Payer: Self-pay | Admitting: Nurse Practitioner

## 2022-12-10 MED ORDER — FERROUS SULFATE 325 (65 FE) MG PO TBEC: DELAYED_RELEASE_TABLET | ORAL | 0 refills | Status: DC

## 2023-03-09 ENCOUNTER — Other Ambulatory Visit: Payer: Self-pay | Admitting: Nurse Practitioner

## 2023-04-09 ENCOUNTER — Encounter (HOSPITAL_BASED_OUTPATIENT_CLINIC_OR_DEPARTMENT_OTHER): Payer: No Typology Code available for payment source | Admitting: Family Medicine

## 2023-04-16 ENCOUNTER — Ambulatory Visit (HOSPITAL_BASED_OUTPATIENT_CLINIC_OR_DEPARTMENT_OTHER): Payer: No Typology Code available for payment source | Admitting: Family Medicine

## 2023-04-16 ENCOUNTER — Encounter (HOSPITAL_BASED_OUTPATIENT_CLINIC_OR_DEPARTMENT_OTHER): Payer: Self-pay | Admitting: Family Medicine

## 2023-04-16 VITALS — BP 132/100 | HR 90 | Ht 62.0 in | Wt 188.8 lb

## 2023-04-16 DIAGNOSIS — Z0000 Encounter for general adult medical examination without abnormal findings: Secondary | ICD-10-CM | POA: Diagnosis not present

## 2023-04-16 DIAGNOSIS — R030 Elevated blood-pressure reading, without diagnosis of hypertension: Secondary | ICD-10-CM | POA: Diagnosis not present

## 2023-04-16 DIAGNOSIS — E559 Vitamin D deficiency, unspecified: Secondary | ICD-10-CM | POA: Diagnosis not present

## 2023-04-16 NOTE — Progress Notes (Unsigned)
Complete physical exam  Patient: Stacey Keith   DOB: 1985-12-26   37 y.o. Female  MRN: [id number]  Subjective:    Stacey Keith is a 37 y.o. female who presents today for a complete physical exam. She reports consuming a general diet. The patient does not participate in regular exercise at present. She was promoted in her career and finds it difficult to make time to exercise. She generally feels well. She reports sleeping well. She does not have additional problems to discuss today.   Current medical concerns: none  Regular vision exam q1-5 years: April 2024  Regular dental exams: q6 months: due October 2024  Pap done in 05/21/2019- HPV negative  IUD placed 2022- due 2025 for removal   Depression screenings:    04/16/2023    2:48 PM 01/11/2022   10:07 AM 11/27/2021    1:27 PM  Depression screen PHQ 2/9  Decreased Interest 0 0 0  Down, Depressed, Hopeless 0 0 0  PHQ - 2 Score 0 0 0    Anxiety screenings:    04/16/2023    2:49 PM 11/16/2019    9:19 AM 10/10/2017    7:57 AM  GAD 7 : Generalized Anxiety Score  Nervous, Anxious, on Edge 1 1 0  Control/stop worrying 0 0 0  Worry too much - different things 0 1 0  Trouble relaxing 1 0 0  Restless 0 0 0  Easily annoyed or irritable 0 1 0  Afraid - awful might happen 0 0 0  Total GAD 7 Score 2 3 0  Anxiety Difficulty Not difficult at all Not difficult at all     Patient Care Team: Alyson Reedy, FNP as PCP - General (Family Medicine)   Outpatient Medications Prior to Visit  Medication Sig   etonogestrel (NEXPLANON) 68 MG IMPL implant 1 each by Subdermal route once. 04/24/2021 placed   ferrous sulfate 325 (65 FE) MG EC tablet TAKE 1 TABLET BY MOUTH EVERY DAY WITH BREAKFAST (Patient taking differently: TAKE 1 TABLET BY MOUTH EVERY DAY WITH BREAKFAST (3 days a week))   Vitamin D, Ergocalciferol, (DRISDOL) 1.25 MG (50000 UNIT) CAPS capsule TAKE 1 CAPSULE BY MOUTH EVERY 7 DAYS FOR A TOTAL OF 8 WEEKS (Patient taking differently:  Typically only uses in the winter months)   No facility-administered medications prior to visit.    Review of Systems  Constitutional:  Negative for malaise/fatigue.  HENT:  Negative for ear pain and tinnitus.   Eyes:  Negative for blurred vision and double vision.  Respiratory:  Negative for cough and shortness of breath.   Cardiovascular:  Negative for chest pain, palpitations and leg swelling.  Gastrointestinal:  Negative for abdominal pain, nausea and vomiting.  Musculoskeletal:  Negative for myalgias.  Neurological:  Negative for dizziness, weakness and headaches.  Psychiatric/Behavioral:  Negative for depression and suicidal ideas. The patient is not nervous/anxious and does not have insomnia.        Objective:     BP (!) 132/100 Comment: Repeat BP  Pulse 90   Ht 5\' 2"  (1.575 m)   Wt 188 lb 12.8 oz (85.6 kg)   SpO2 100%   Breastfeeding No   BMI 34.53 kg/m  BP Readings from Last 3 Encounters:  04/16/23 (!) 132/100  01/11/22 117/72  11/27/21 117/72     Physical Exam Constitutional:      Appearance: Normal appearance.  HENT:     Head: Normocephalic.     Right Ear: Tympanic membrane, ear canal  and external ear normal.     Left Ear: Tympanic membrane, ear canal and external ear normal.     Nose: Nose normal.     Mouth/Throat:     Mouth: Mucous membranes are moist.     Pharynx: Oropharynx is clear.  Eyes:     Extraocular Movements: Extraocular movements intact.     Pupils: Pupils are equal, round, and reactive to light.  Cardiovascular:     Rate and Rhythm: Normal rate and regular rhythm.     Pulses: Normal pulses.     Heart sounds: Normal heart sounds.  Pulmonary:     Effort: Pulmonary effort is normal.     Breath sounds: Normal breath sounds.  Abdominal:     General: Abdomen is flat. Bowel sounds are normal.     Palpations: Abdomen is soft.  Musculoskeletal:        General: Normal range of motion.     Cervical back: Normal range of motion.  Skin:     General: Skin is warm and dry.  Neurological:     Mental Status: She is alert.  Psychiatric:        Mood and Affect: Mood normal.        Behavior: Behavior normal.        Thought Content: Thought content normal.        Judgment: Judgment normal.         Assessment & Plan:    Routine Health Maintenance and Physical Exam  Health Maintenance  Topic Date Due   COVID-19 Vaccine (3 - 2023-24 season) 06/01/2022   Flu Shot  05/02/2023   Pap Smear  05/17/2024   DTaP/Tdap/Td vaccine (3 - Td or Tdap) 02/02/2027   HIV Screening  Completed   HPV Vaccine  Aged Out   Hepatitis C Screening  Discontinued   1. Wellness examination Routine HCM labs ordered. Will obtain labs today and update patient with results.  Review of PMH, FH, SH, medications and HM performed.  Recommend healthy diet.  Recommend approximately 150 minutes/week of moderate intensity exercise. Recommend regular dental and vision exams. Always use seatbelt/lap and shoulder restraints. Recommend using smoke alarms and checking batteries at least twice a year. Recommend using sunscreen when outside. Vaccines are UTD.  Nexplanon due for replacement 03/2024. Vaccines are up to date:  yes Colonoscopy:  not due MMG:  not due BMD:  not due  Last pap smear:  05/21/2019, HPV negative  - CBC with Differential/Platelet - Comprehensive metabolic panel - Hemoglobin A1c - Lipid panel - TSH Rfx on Abnormal to Free T4 - VITAMIN D 25 Hydroxy (Vit-D Deficiency, Fractures)  2. Vitamin D deficiency Patient was previously treated for vitamin D deficiency with high-dose vitamin D and has not had her levels repeated. Reasonable to recheck vitamin D levels today and treat accordingly.   3. Elevated blood pressure reading in office without diagnosis of hypertension Patient presents today with slightly elevated blood pressure. Patient in no acute distress and is well-appearing. Denies chest pain, shortness of breath, lower extremity edema,  vision changes, headaches. Cardiovascular exam with heart regular rate and rhythm. Normal heart sounds, no murmurs present. No lower extremity edema present. Lungs clear to auscultation bilaterally. Advised patient to follow a low-sodium diet and participate in daily exercise. Counseled patient in checking blood pressure readings at home. Advised patient to send in home readings in 4 weeks.   No follow-ups on file.     Alyson Reedy, FNP

## 2023-04-17 DIAGNOSIS — E559 Vitamin D deficiency, unspecified: Secondary | ICD-10-CM | POA: Insufficient documentation

## 2023-04-17 DIAGNOSIS — R030 Elevated blood-pressure reading, without diagnosis of hypertension: Secondary | ICD-10-CM | POA: Insufficient documentation

## 2023-04-17 LAB — TSH RFX ON ABNORMAL TO FREE T4: TSH: 1.23 u[IU]/mL (ref 0.450–4.500)

## 2023-04-17 LAB — LIPID PANEL
Chol/HDL Ratio: 5.9 ratio — ABNORMAL HIGH (ref 0.0–4.4)
Cholesterol, Total: 235 mg/dL — ABNORMAL HIGH (ref 100–199)
HDL: 40 mg/dL (ref >39)
LDL Chol Calc (NIH): 160 mg/dL — ABNORMAL HIGH (ref 0–99)
Triglycerides: 192 mg/dL — ABNORMAL HIGH (ref 0–149)
VLDL Cholesterol Cal: 35 mg/dL (ref 5–40)

## 2023-04-17 LAB — CBC WITH DIFFERENTIAL/PLATELET
Basophils Absolute: 0 10*3/uL (ref 0.0–0.2)
Basos: 1 %
EOS (ABSOLUTE): 0.1 10*3/uL (ref 0.0–0.4)
Eos: 1 %
Hematocrit: 43.4 % (ref 34.0–46.6)
Hemoglobin: 14.1 g/dL (ref 11.1–15.9)
Immature Grans (Abs): 0 10*3/uL (ref 0.0–0.1)
Immature Granulocytes: 0 %
Lymphocytes Absolute: 3.5 10*3/uL — ABNORMAL HIGH (ref 0.7–3.1)
Lymphs: 45 %
MCH: 27.7 pg (ref 26.6–33.0)
MCHC: 32.5 g/dL (ref 31.5–35.7)
MCV: 85 fL (ref 79–97)
Monocytes Absolute: 0.6 10*3/uL (ref 0.1–0.9)
Monocytes: 7 %
Neutrophils Absolute: 3.5 10*3/uL (ref 1.4–7.0)
Neutrophils: 46 %
Platelets: 355 10*3/uL (ref 150–450)
RBC: 5.09 x10E6/uL (ref 3.77–5.28)
RDW: 13.1 % (ref 11.7–15.4)
WBC: 7.6 10*3/uL (ref 3.4–10.8)

## 2023-04-17 LAB — COMPREHENSIVE METABOLIC PANEL
ALT: 33 IU/L — ABNORMAL HIGH (ref 0–32)
AST: 24 IU/L (ref 0–40)
Albumin: 4.6 g/dL (ref 3.9–4.9)
Alkaline Phosphatase: 53 IU/L (ref 44–121)
BUN/Creatinine Ratio: 13 (ref 9–23)
BUN: 12 mg/dL (ref 6–20)
Bilirubin Total: 0.4 mg/dL (ref 0.0–1.2)
CO2: 24 mmol/L (ref 20–29)
Calcium: 10.6 mg/dL — ABNORMAL HIGH (ref 8.7–10.2)
Chloride: 103 mmol/L (ref 96–106)
Creatinine, Ser: 0.91 mg/dL (ref 0.57–1.00)
Globulin, Total: 3 g/dL (ref 1.5–4.5)
Glucose: 98 mg/dL (ref 70–99)
Potassium: 4.6 mmol/L (ref 3.5–5.2)
Sodium: 141 mmol/L (ref 134–144)
Total Protein: 7.6 g/dL (ref 6.0–8.5)
eGFR: 85 mL/min/{1.73_m2} (ref >59)

## 2023-04-17 LAB — HEMOGLOBIN A1C
Est. average glucose Bld gHb Est-mCnc: 108 mg/dL
Hgb A1c MFr Bld: 5.4 % (ref 4.8–5.6)

## 2023-04-17 LAB — VITAMIN D 25 HYDROXY (VIT D DEFICIENCY, FRACTURES): Vit D, 25-Hydroxy: 36.3 ng/mL (ref 30.0–100.0)

## 2023-08-22 ENCOUNTER — Encounter (HOSPITAL_BASED_OUTPATIENT_CLINIC_OR_DEPARTMENT_OTHER): Payer: Self-pay | Admitting: Family Medicine

## 2023-10-10 ENCOUNTER — Telehealth: Payer: No Typology Code available for payment source | Admitting: Family Medicine

## 2023-10-10 DIAGNOSIS — J069 Acute upper respiratory infection, unspecified: Secondary | ICD-10-CM

## 2023-10-11 ENCOUNTER — Other Ambulatory Visit (HOSPITAL_BASED_OUTPATIENT_CLINIC_OR_DEPARTMENT_OTHER): Payer: Self-pay

## 2023-10-11 MED ORDER — FLUTICASONE PROPIONATE 50 MCG/ACT NA SUSP
2.0000 | Freq: Every day | NASAL | 6 refills | Status: DC
  Filled 2023-10-11: qty 16, 30d supply, fill #0

## 2023-10-11 NOTE — Progress Notes (Signed)
 E-Visit for Sinus Problems  We are sorry that you are not feeling well.  Here is how we plan to help!  Based on what you have shared with me it looks like you have sinusitis.  Sinusitis is inflammation and infection in the sinus cavities of the head.  Based on your presentation I believe you most likely have Acute Viral Sinusitis.This is an infection most likely caused by a virus. There is not specific treatment for viral sinusitis other than to help you with the symptoms until the infection runs its course.  You may use an oral decongestant such as Mucinex  D or if you have glaucoma or high blood pressure use plain Mucinex . Saline nasal spray help and can safely be used as often as needed for congestion, I have prescribed: Fluticasone  nasal spray two sprays in each nostril once a day  Some authorities believe that zinc sprays or the use of Echinacea may shorten the course of your symptoms.  Sinus infections are not as easily transmitted as other respiratory infection, however we still recommend that you avoid close contact with loved ones, especially the very young and elderly.  Remember to wash your hands thoroughly throughout the day as this is the number one way to prevent the spread of infection!  Home Care: Only take medications as instructed by your medical team. Do not take these medications with alcohol. A steam or ultrasonic humidifier can help congestion.  You can place a towel over your head and breathe in the steam from hot water  coming from a faucet. Avoid close contacts especially the very young and the elderly. Cover your mouth when you cough or sneeze. Always remember to wash your hands.  Get Help Right Away If: You develop worsening fever or sinus pain. You develop a severe head ache or visual changes. Your symptoms persist after you have completed your treatment plan.  Make sure you Understand these instructions. Will watch your condition. Will get help right away if you  are not doing well or get worse.   Thank you for choosing an e-visit.  Your e-visit answers were reviewed by a board certified advanced clinical practitioner to complete your personal care plan. Depending upon the condition, your plan could have included both over the counter or prescription medications.  Please review your pharmacy choice. Make sure the pharmacy is open so you can pick up prescription now. If there is a problem, you may contact your provider through [employer] and have the prescription routed to another pharmacy.  Your safety is important to us . If you have drug allergies check your prescription carefully.   For the next 24 hours you can use MyChart to ask questions about today's visit, request a non-urgent call back, or ask for a work or school excuse. You will get an email in the next two days asking about your experience. I hope that your e-visit has been valuable and will speed your recovery.   have provided 5 minutes of non face to face time during this encounter for chart review and documentation.

## 2023-10-22 ENCOUNTER — Other Ambulatory Visit (HOSPITAL_BASED_OUTPATIENT_CLINIC_OR_DEPARTMENT_OTHER): Payer: Self-pay

## 2023-11-18 ENCOUNTER — Encounter (HOSPITAL_BASED_OUTPATIENT_CLINIC_OR_DEPARTMENT_OTHER): Payer: Self-pay | Admitting: Certified Nurse Midwife

## 2023-11-18 ENCOUNTER — Ambulatory Visit (HOSPITAL_BASED_OUTPATIENT_CLINIC_OR_DEPARTMENT_OTHER): Payer: No Typology Code available for payment source | Admitting: Certified Nurse Midwife

## 2023-11-18 VITALS — BP 132/90 | HR 90 | Ht 62.0 in | Wt 190.0 lb

## 2023-11-18 DIAGNOSIS — Z3046 Encounter for surveillance of implantable subdermal contraceptive: Secondary | ICD-10-CM

## 2023-11-18 NOTE — Progress Notes (Signed)
   GYNECOLOGY CLINIC PROCEDURE NOTE  Stacey Keith is a 38 y.o. [id number] here for Nexplanon removal.  Last pap smear was on 05/2019 and was normal. Pt plans to RTO for annual gyn and pap smear August 2025 (normal/negative pap). She has had Nexplanon for over 3 years and recently started to experience irregular spotting or bleeding. She is in a relationship but they plan to prevent pregnancy. She has a son/daughter at [employer] doing well. She works from home for [employer].  No other gynecologic concerns.  Nexplanon Removal Patient identified, informed consent performed, consent signed.   Appropriate time out taken. Nexplanon site identified.  Area prepped in usual sterile fashon. One ml of 1% lidocaine was used to anesthetize the area at the distal end of the implant. A small stab incision was made right beside the implant on the distal portion.  The Nexplanon rod was grasped using hemostats and removed without difficulty.  There was minimal blood loss. There were no complications.  3 ml of 1% lidocaine was injected around the incision for post-procedure analgesia.  Steri-strips were applied over the small incision.  A pressure bandage was applied to reduce any bruising.  The patient tolerated the procedure well and was given post procedure instructions.  Patient is planning to use condoms/withdrawal  for contraception/attempt conception.  RTO August 2025 for annual gyn exam and routine pap smear and prn if issues arise.  Letta Kocher, CNM 1:16 PM

## 2024-04-09 ENCOUNTER — Encounter (HOSPITAL_BASED_OUTPATIENT_CLINIC_OR_DEPARTMENT_OTHER): Payer: Self-pay | Admitting: Certified Nurse Midwife

## 2024-04-09 ENCOUNTER — Ambulatory Visit (HOSPITAL_BASED_OUTPATIENT_CLINIC_OR_DEPARTMENT_OTHER): Admitting: Certified Nurse Midwife

## 2024-04-09 VITALS — BP 125/80 | HR 80 | Ht 62.0 in | Wt 192.4 lb

## 2024-04-09 DIAGNOSIS — Z30017 Encounter for initial prescription of implantable subdermal contraceptive: Secondary | ICD-10-CM

## 2024-04-09 MED ORDER — ETONOGESTREL 68 MG ~~LOC~~ IMPL
68.0000 mg | DRUG_IMPLANT | Freq: Once | SUBCUTANEOUS | Status: AC
  Administered 2024-04-09: 68 mg via SUBCUTANEOUS

## 2024-04-09 NOTE — Addendum Note (Signed)
 Addended by: DARRYLE PRESIDENT B on: 04/09/2024 01:59 PM   Modules accepted: Orders

## 2024-04-09 NOTE — Progress Notes (Signed)
   GYNECOLOGY  VISIT  CC:   Here for Nexplanon  Insertion Right Arm. Pt states there is no risk of pregnancy. She had Nexplanon  in the past (same arm). Would prefer to have it in same location as before.    Past Medical History:  Diagnosis Date   Anemia    Mastitis 04/2015   Medical history non-contributory    Relies on partner vasectomy for contraception 11/16/2019    MEDS:   Current Outpatient Medications on File Prior to Visit  Medication Sig Dispense Refill   ferrous sulfate  325 (65 FE) MG EC tablet TAKE 1 TABLET BY MOUTH EVERY DAY WITH BREAKFAST (Patient taking differently: TAKE 1 TABLET BY MOUTH EVERY DAY WITH BREAKFAST (3 days a week)) 30 tablet 0   Vitamin D , Ergocalciferol , (DRISDOL ) 1.25 MG (50000 UNIT) CAPS capsule TAKE 1 CAPSULE BY MOUTH EVERY 7 DAYS FOR A TOTAL OF 8 WEEKS (Patient not taking: Reported on 11/18/2023) 4 capsule 1   No current facility-administered medications on file prior to visit.    ALLERGIES: Patient has no known allergies.  PHYSICAL EXAMINATION:    BP 125/80   Pulse 80   Ht 5' 2 (1.575 m) Comment: Reported  Wt 192 lb 6.4 oz (87.3 kg)   LMP 04/07/2024   BMI 35.19 kg/m     General appearance: alert, cooperative and appears stated age  Assessment/Plan:  1. Nexplanon  insertion (Primary)  Nexplanon  Insertion Procedure Patient identified, informed consent performed, consent signed.   Patient does understand that irregular bleeding is a very common side effect of this medication. She was advised to have backup contraception for 2 weeks after placement. Pregnancy test in clinic today was negative.  Appropriate time out taken.  Patient's right  arm was prepped and draped in the usual sterile fashion.. The ruler used to measure and mark insertion area.  Patient was prepped with alcohol swab and then injected with 3 ml of 1% lidocaine .  She was prepped with betadine, Nexplanon  removed from packaging,  Device confirmed in needle, then inserted full length  of needle and withdrawn per handbook instructions. Nexplanon  was able to palpated in the patient's arm; patient palpated the insert herself. There was minimal blood loss.  Patient insertion site covered with guaze and a pressure bandage to reduce any bruising.  The patient tolerated the procedure well and was given post procedure instructions.   Arland MARLA Roller

## 2024-04-17 ENCOUNTER — Ambulatory Visit: Admitting: Family

## 2024-05-19 ENCOUNTER — Ambulatory Visit (INDEPENDENT_AMBULATORY_CARE_PROVIDER_SITE_OTHER): Admitting: Certified Nurse Midwife

## 2024-05-19 ENCOUNTER — Encounter (HOSPITAL_BASED_OUTPATIENT_CLINIC_OR_DEPARTMENT_OTHER): Payer: Self-pay | Admitting: Certified Nurse Midwife

## 2024-05-19 ENCOUNTER — Other Ambulatory Visit (HOSPITAL_COMMUNITY)
Admission: RE | Admit: 2024-05-19 | Discharge: 2024-05-19 | Disposition: A | Discharge status: Home / Self Care | Source: Ambulatory Visit | Attending: Certified Nurse Midwife | Admitting: Certified Nurse Midwife

## 2024-05-19 VITALS — BP 127/89 | HR 75 | Ht 62.0 in | Wt 187.8 lb

## 2024-05-19 DIAGNOSIS — Z3046 Encounter for surveillance of implantable subdermal contraceptive: Secondary | ICD-10-CM

## 2024-05-19 DIAGNOSIS — Z1151 Encounter for screening for human papillomavirus (HPV): Secondary | ICD-10-CM

## 2024-05-19 DIAGNOSIS — Z124 Encounter for screening for malignant neoplasm of cervix: Secondary | ICD-10-CM | POA: Diagnosis present

## 2024-05-19 DIAGNOSIS — Z01419 Encounter for gynecological examination (general) (routine) without abnormal findings: Secondary | ICD-10-CM

## 2024-05-19 DIAGNOSIS — Z0000 Encounter for general adult medical examination without abnormal findings: Secondary | ICD-10-CM

## 2024-05-19 NOTE — Progress Notes (Signed)
 38 y.o. [id number] Significant Other White or Caucasian female here for annual exam. Her children are starting school [employer] today and both doing well 2nd and 4th.   Patient's last menstrual period was 04/07/2024.          Sexually active: Yes.    The current method of family planning is Nexplanon  Right Arm She does not desire future childbearing. Exercising: Yes.    Home exercise routine includes walking 2 hrs per week. Smoker:  no  Health Maintenance: Pap:  05/2019 9 ([facility]) History of abnormal Pap:  no    reports that she has never smoked. She has never used smokeless tobacco. She reports that she does not drink alcohol and does not use drugs.  Past Medical History:  Diagnosis Date   Anemia    Mastitis 04/2015   Medical history non-contributory    Relies on partner vasectomy for contraception 11/16/2019    Past Surgical History:  Procedure Laterality Date   CESAREAN SECTION N/A 04/07/2015   Procedure: CESAREAN SECTION;  Surgeon: Glenys GORMAN Birk, MD;  Location: [facility];  Service: Obstetrics;  Laterality: N/A;   CESAREAN SECTION N/A 04/23/2017   Procedure: CESAREAN SECTION;  Surgeon: Barbra Lang PARAS, DO;  Location: [facility];  Service: Obstetrics;  Laterality: N/A;   EAR TUBE REMOVAL Right    20 YEARS AGO   UTERINE SEPTUM RESECTION  04/27/2014   WISDOM TOOTH EXTRACTION      Current Outpatient Medications  Medication Sig Dispense Refill   ferrous sulfate  325 (65 FE) MG EC tablet TAKE 1 TABLET BY MOUTH EVERY DAY WITH BREAKFAST (Patient taking differently: TAKE 1 TABLET BY MOUTH EVERY DAY WITH BREAKFAST (3 days a week)) 30 tablet 0   Vitamin D , Ergocalciferol , (DRISDOL ) 1.25 MG (50000 UNIT) CAPS capsule TAKE 1 CAPSULE BY MOUTH EVERY 7 DAYS FOR A TOTAL OF 8 WEEKS (Patient not taking: Reported on 05/19/2024) 4 capsule 1   No current facility-administered medications for this visit.    Family History  Problem Relation Age of Onset   Cancer Paternal Grandfather         PROSTATE   Breast cancer Paternal Grandmother    Cancer Paternal Grandmother        BREAST   Cancer Maternal Grandmother        BREAST   Stroke Maternal Grandmother    Cancer Maternal Grandfather        KIDNEY   Diverticulitis Father    Diabetes Brother     ROS: Constitutional: negative Genitourinary:negative  Exam:   BP 127/89   Pulse 75   Ht 5' 2 (1.575 m)   Wt 187 lb 12.8 oz (85.2 kg)   LMP 04/07/2024 Comment: Having some on/off spotting since the insertion of the Nexplanon   BMI 34.35 kg/m   Height: 5' 2 (157.5 cm)  General appearance: alert, cooperative and appears stated age Head: Normocephalic, without obvious abnormality, atraumatic Breasts: normal appearance, no masses or tenderness, Inspection negative, No nipple retraction or dimpling, No nipple discharge or bleeding, No axillary or supraclavicular adenopathy, Normal to palpation without dominant masses Heart: regular rate and rhythm Abdomen: soft, non-tender; bowel sounds normal; no masses,  no organomegaly Extremities: extremities normal, atraumatic, no cyanosis or edema Skin: Skin color, texture, turgor normal. No rashes or lesions Lymph nodes: Cervical, supraclavicular, and axillary nodes normal. No abnormal inguinal nodes palpated Neurologic: Grossly normal   Pelvic: External genitalia:  no lesions  Urethra:  normal appearing urethra with no masses, tenderness or lesions              Bartholins and Skenes: normal                 Vagina: normal appearing vagina with normal color and no discharge, no lesions              Cervix: no bleeding following Pap, no cervical motion tenderness, and no lesions              Pap taken: Yes.   Bimanual Exam:  Uterus:  normal size, contour, position, consistency, mobility, non-tender              Adnexa: no mass, fullness, tenderness               Rectovaginal: Confirms               Anus:  normal sphincter tone, no lesions  Chaperone,  CMA, was  present for exam.  Assessment/Plan:  1. Women's annual routine gynecological examination (Primary) - Routine pap smear collected - No risk of STD - Pt aware of availability of HPV Vaccines - Continue breast self-awareness - Pt doing well with Nexplanon  (palpable right arm)  2. Physical exam - Lipid Panel With LDL/HDL Ratio - CBC - Comp Met (CMET) - TSH - VITAMIN D  25 Hydroxy (Vit-D Deficiency, Fractures) - Hemoglobin A1c  3. Cervical cancer screening - Cytology - PAP( [facility])   RTO 1 year for annual gyn exam and prn if issues arise. Arland MARLA Roller

## 2024-05-20 LAB — COMPREHENSIVE METABOLIC PANEL WITH GFR
ALT: 20 IU/L (ref 0–32)
AST: 22 IU/L (ref 0–40)
Albumin: 4.5 g/dL (ref 3.9–4.9)
Alkaline Phosphatase: 56 IU/L (ref 44–121)
BUN/Creatinine Ratio: 17 (ref 9–23)
BUN: 14 mg/dL (ref 6–20)
Bilirubin Total: 0.6 mg/dL (ref 0.0–1.2)
CO2: 20 mmol/L (ref 20–29)
Calcium: 9.7 mg/dL (ref 8.7–10.2)
Chloride: 103 mmol/L (ref 96–106)
Creatinine, Ser: 0.83 mg/dL (ref 0.57–1.00)
Globulin, Total: 2.7 g/dL (ref 1.5–4.5)
Glucose: 86 mg/dL (ref 70–99)
Potassium: 4.2 mmol/L (ref 3.5–5.2)
Sodium: 140 mmol/L (ref 134–144)
Total Protein: 7.2 g/dL (ref 6.0–8.5)
eGFR: 94 mL/min/1.73 (ref >59)

## 2024-05-20 LAB — LIPID PANEL WITH LDL/HDL RATIO
Cholesterol, Total: 218 mg/dL — ABNORMAL HIGH (ref 100–199)
HDL: 35 mg/dL — ABNORMAL LOW (ref >39)
LDL Chol Calc (NIH): 160 mg/dL — ABNORMAL HIGH (ref 0–99)
LDL/HDL Ratio: 4.6 ratio — ABNORMAL HIGH (ref 0.0–3.2)
Triglycerides: 125 mg/dL (ref 0–149)
VLDL Cholesterol Cal: 23 mg/dL (ref 5–40)

## 2024-05-20 LAB — CBC
Hematocrit: 39.8 % (ref 34.0–46.6)
Hemoglobin: 13 g/dL (ref 11.1–15.9)
MCH: 28 pg (ref 26.6–33.0)
MCHC: 32.7 g/dL (ref 31.5–35.7)
MCV: 86 fL (ref 79–97)
Platelets: 294 x10E3/uL (ref 150–450)
RBC: 4.65 x10E6/uL (ref 3.77–5.28)
RDW: 13.6 % (ref 11.7–15.4)
WBC: 6.6 x10E3/uL (ref 3.4–10.8)

## 2024-05-20 LAB — CYTOLOGY - PAP
Comment: NEGATIVE
Diagnosis: NEGATIVE
High risk HPV: NEGATIVE

## 2024-05-20 LAB — HEMOGLOBIN A1C
Est. average glucose Bld gHb Est-mCnc: 108 mg/dL
Hgb A1c MFr Bld: 5.4 % (ref 4.8–5.6)

## 2024-05-20 LAB — VITAMIN D 25 HYDROXY (VIT D DEFICIENCY, FRACTURES): Vit D, 25-Hydroxy: 35.9 ng/mL (ref 30.0–100.0)

## 2024-05-20 LAB — TSH: TSH: 1.33 u[IU]/mL (ref 0.450–4.500)

## 2024-05-21 ENCOUNTER — Ambulatory Visit (HOSPITAL_BASED_OUTPATIENT_CLINIC_OR_DEPARTMENT_OTHER): Payer: Self-pay | Admitting: Certified Nurse Midwife

## 2024-05-21 ENCOUNTER — Other Ambulatory Visit (HOSPITAL_BASED_OUTPATIENT_CLINIC_OR_DEPARTMENT_OTHER): Payer: Self-pay

## 2024-05-21 MED ORDER — VITAMIN D (ERGOCALCIFEROL) 1.25 MG (50000 UNIT) PO CAPS
50000.0000 [IU] | ORAL_CAPSULE | ORAL | 6 refills | Status: AC
  Filled 2024-05-21: qty 12, 84d supply, fill #0

## 2024-06-02 ENCOUNTER — Other Ambulatory Visit (HOSPITAL_BASED_OUTPATIENT_CLINIC_OR_DEPARTMENT_OTHER): Payer: Self-pay

## 2024-08-14 ENCOUNTER — Encounter (HOSPITAL_BASED_OUTPATIENT_CLINIC_OR_DEPARTMENT_OTHER): Payer: Self-pay | Admitting: Certified Nurse Midwife

## 2024-08-15 ENCOUNTER — Other Ambulatory Visit (HOSPITAL_BASED_OUTPATIENT_CLINIC_OR_DEPARTMENT_OTHER): Payer: Self-pay | Admitting: Obstetrics & Gynecology

## 2024-08-15 DIAGNOSIS — L732 Hidradenitis suppurativa: Secondary | ICD-10-CM

## 2024-08-15 MED ORDER — DOXYCYCLINE HYCLATE 100 MG PO CAPS
100.0000 mg | ORAL_CAPSULE | Freq: Two times a day (BID) | ORAL | 1 refills | Status: AC
Start: 2024-08-15 — End: ?

## 2024-08-15 MED ORDER — CLINDAMYCIN PHOSPHATE 1 % EX SOLN: Freq: Two times a day (BID) | CUTANEOUS | 1 refills | Status: AC
